# Patient Record
Sex: Female | Born: 1945
Health system: Southern US, Community
[De-identification: ages and names within clinical notes are randomized; demographics above are authoritative.]

## PROBLEM LIST (undated history)

## (undated) DIAGNOSIS — E785 Hyperlipidemia, unspecified: Secondary | ICD-10-CM

## (undated) DIAGNOSIS — M1990 Unspecified osteoarthritis, unspecified site: Secondary | ICD-10-CM

## (undated) DIAGNOSIS — I10 Essential (primary) hypertension: Secondary | ICD-10-CM

## (undated) HISTORY — DX: Essential (primary) hypertension: I10

## (undated) HISTORY — DX: Unspecified osteoarthritis, unspecified site: M19.90

## (undated) HISTORY — DX: Hyperlipidemia, unspecified: E78.5

---

## 2001-06-24 ENCOUNTER — Encounter: Payer: Self-pay | Admitting: Unknown Physician Specialty

## 2001-06-24 ENCOUNTER — Ambulatory Visit (HOSPITAL_COMMUNITY): Admission: RE | Admit: 2001-06-24 | Discharge: 2001-06-24 | Payer: Self-pay | Admitting: Unknown Physician Specialty

## 2001-12-27 ENCOUNTER — Ambulatory Visit (HOSPITAL_COMMUNITY): Admission: RE | Admit: 2001-12-27 | Discharge: 2001-12-27 | Payer: Self-pay | Admitting: Orthopaedic Surgery

## 2001-12-27 ENCOUNTER — Encounter: Payer: Self-pay | Admitting: Orthopaedic Surgery

## 2001-12-30 ENCOUNTER — Encounter: Payer: Self-pay | Admitting: Internal Medicine

## 2001-12-30 ENCOUNTER — Ambulatory Visit (HOSPITAL_COMMUNITY): Admission: RE | Admit: 2001-12-30 | Discharge: 2001-12-30 | Payer: Self-pay | Admitting: Internal Medicine

## 2002-11-02 ENCOUNTER — Ambulatory Visit (HOSPITAL_COMMUNITY): Admission: RE | Admit: 2002-11-02 | Discharge: 2002-11-02 | Payer: Self-pay | Admitting: Family Medicine

## 2002-11-02 ENCOUNTER — Encounter: Payer: Self-pay | Admitting: Family Medicine

## 2003-07-14 ENCOUNTER — Ambulatory Visit (HOSPITAL_COMMUNITY): Admission: RE | Admit: 2003-07-14 | Discharge: 2003-07-14 | Payer: Self-pay | Admitting: Unknown Physician Specialty

## 2003-07-14 ENCOUNTER — Encounter: Payer: Self-pay | Admitting: Emergency Medicine

## 2005-08-18 ENCOUNTER — Ambulatory Visit (HOSPITAL_COMMUNITY): Admission: RE | Admit: 2005-08-18 | Discharge: 2005-08-18 | Payer: Self-pay | Admitting: Ophthalmology

## 2005-09-30 ENCOUNTER — Ambulatory Visit (HOSPITAL_COMMUNITY): Admission: RE | Admit: 2005-09-30 | Discharge: 2005-09-30 | Payer: Self-pay | Admitting: Ophthalmology

## 2009-10-25 ENCOUNTER — Ambulatory Visit (HOSPITAL_COMMUNITY): Admission: RE | Admit: 2009-10-25 | Discharge: 2009-10-25 | Payer: Self-pay | Admitting: Family Medicine

## 2009-11-16 ENCOUNTER — Other Ambulatory Visit: Admission: RE | Admit: 2009-11-16 | Discharge: 2009-11-16 | Payer: Self-pay | Admitting: Family Medicine

## 2009-11-30 ENCOUNTER — Encounter: Payer: Self-pay | Admitting: Gastroenterology

## 2009-12-13 ENCOUNTER — Encounter: Payer: Self-pay | Admitting: Gastroenterology

## 2009-12-14 ENCOUNTER — Ambulatory Visit (HOSPITAL_COMMUNITY): Admission: RE | Admit: 2009-12-14 | Discharge: 2009-12-14 | Payer: Self-pay | Admitting: Gastroenterology

## 2009-12-14 ENCOUNTER — Ambulatory Visit: Payer: Self-pay | Admitting: Gastroenterology

## 2010-10-29 NOTE — Letter (Signed)
Summary: TRIAGE  TRIAGE   Imported By: Diana Eves 11/30/2009 12:06:04  _____________________________________________________________________  External Attachment:    Type:   Image     Comment:   External Document

## 2010-10-29 NOTE — Letter (Signed)
Summary: ADVANTRA AUTHORIZATION REQUEST   ADVANTRA AUTHORIZATION REQUEST   Imported By: Ave Filter 12/13/2009 15:15:55  _____________________________________________________________________  External Attachment:    Type:   Image     Comment:   External Document  Appended Document: ADVANTRA AUTHORIZATION REQUEST  Aut# [id number]

## 2011-03-24 ENCOUNTER — Other Ambulatory Visit (HOSPITAL_COMMUNITY): Payer: Self-pay | Admitting: Ophthalmology

## 2011-03-24 ENCOUNTER — Encounter (HOSPITAL_COMMUNITY)
Admission: RE | Admit: 2011-03-24 | Discharge: 2011-03-24 | Disposition: A | Discharge status: Home / Self Care | Payer: Medicare Other | Source: Ambulatory Visit | Attending: Ophthalmology | Admitting: Ophthalmology

## 2011-03-24 ENCOUNTER — Ambulatory Visit (HOSPITAL_COMMUNITY)
Admission: RE | Admit: 2011-03-24 | Discharge: 2011-03-24 | Disposition: A | Discharge status: Home / Self Care | Payer: Medicare Other | Source: Ambulatory Visit | Attending: Ophthalmology | Admitting: Ophthalmology

## 2011-03-24 DIAGNOSIS — H409 Unspecified glaucoma: Secondary | ICD-10-CM | POA: Insufficient documentation

## 2011-03-24 DIAGNOSIS — Z01818 Encounter for other preprocedural examination: Secondary | ICD-10-CM | POA: Insufficient documentation

## 2011-03-24 DIAGNOSIS — Z01810 Encounter for preprocedural cardiovascular examination: Secondary | ICD-10-CM | POA: Insufficient documentation

## 2011-03-24 DIAGNOSIS — Z01812 Encounter for preprocedural laboratory examination: Secondary | ICD-10-CM | POA: Insufficient documentation

## 2011-03-24 LAB — CBC
HCT: 38.5 % (ref 36.0–46.0)
MCV: 86.9 fL (ref 78.0–100.0)
RDW: 14 % (ref 11.5–15.5)
WBC: 9 10*3/uL (ref 4.0–10.5)

## 2011-03-24 LAB — BASIC METABOLIC PANEL
BUN: 17 mg/dL (ref 6–23)
Chloride: 100 mEq/L (ref 96–112)
Creatinine, Ser: 0.77 mg/dL (ref 0.50–1.10)
GFR calc Af Amer: >60 mL/min (ref >60)

## 2011-03-26 ENCOUNTER — Ambulatory Visit (HOSPITAL_COMMUNITY)
Admission: RE | Admit: 2011-03-26 | Discharge: 2011-03-26 | Disposition: A | Discharge status: Home / Self Care | Payer: Medicare Other | Source: Ambulatory Visit | Attending: Ophthalmology | Admitting: Ophthalmology

## 2011-03-26 DIAGNOSIS — I10 Essential (primary) hypertension: Secondary | ICD-10-CM | POA: Insufficient documentation

## 2011-03-26 DIAGNOSIS — Z01810 Encounter for preprocedural cardiovascular examination: Secondary | ICD-10-CM | POA: Insufficient documentation

## 2011-03-26 DIAGNOSIS — H4020X0 Unspecified primary angle-closure glaucoma, stage unspecified: Secondary | ICD-10-CM | POA: Insufficient documentation

## 2011-03-26 DIAGNOSIS — Z01812 Encounter for preprocedural laboratory examination: Secondary | ICD-10-CM | POA: Insufficient documentation

## 2011-03-26 DIAGNOSIS — Z01818 Encounter for other preprocedural examination: Secondary | ICD-10-CM | POA: Insufficient documentation

## 2011-03-26 DIAGNOSIS — E785 Hyperlipidemia, unspecified: Secondary | ICD-10-CM | POA: Insufficient documentation

## 2011-04-10 NOTE — Op Note (Signed)
Kim Reese, GABA NO.:  [id number]  MEDICAL RECORD NO.:  [id number]  LOCATION:  SDSC                         FACILITY:  [facility]  PHYSICIAN:  Chalmers Guest, M.D.     DATE OF BIRTH:  [birth year]  DATE OF PROCEDURE:  03/26/2011 DATE OF DISCHARGE:  03/26/2011                              OPERATIVE REPORT   PREOPERATIVE DIAGNOSIS:  Uncontrolled angle-closure glaucoma, left eye.  POSTOPERATIVE DIAGNOSIS:  Uncontrolled angle-closure glaucoma, left eye.  PROCEDURE:  Trabeculectomy with mitomycin C.  PREVIOUS HISTORY:  The patient has had maximum tolerated medical therapy as well as laser peripheral iridectomy, and has progressive visual field loss making the surgery medically necessary.  ANESTHESIA:  2% Xylocaine With Epinephrine, 50/50 mixture of 0.75% Marcaine with an ampule of Wydase.  COMPLICATIONS:  None.  DESCRIPTION OF PROCEDURE:  The patient was transported to the operating room where a peribulbar block was given with the aforementioned local anesthetic agent.  Following this, the patient's face was prepped and draped in usual sterile fashion after pressure had been applied to the eye.  With the surgeon sitting superiorly, a 6-0 nylon suture was passed through clear cornea to infraduct the eye with the eye in the intraductal position.  The Hoskin forceps was used to grasp the conjunctiva at the limbus and a sharp Westcott was used to make an incision at the limbus and then a blunt Westcott scissors were used to form a fornix-based conjunctival flap.  A tip blade was used to recess Tenon's fibers and bleeding was controlled with cautery.  A 45-degree blade was used to fashion a partial thickness scleral flap.  The Maumenee-Colibri forceps and the Grieshaber 5700 blade was used to dissect the scleral flap to the limbus.  Following this, mitomycin C 0.4 mg/mL was placed on a Gelfoam sponge placed under  the conjunctiva and allowed to stay on the eye for 2  minutes and then removed and irrigated with 50 mL of balanced salt solution.  At this point, a Wheeler blade 5100 was used through temporal clear cornea to enter the anterior chamber.  Following this with the scleral flap elevated, the 45-degree blade was used on the scleral flap to enter the anterior chamber and 1 x 3 block of tissue was removed.  The iris prolapsed.  Using a Chandler forceps and Vannas scissor, the peripheral iridectomy was formed as the sticky adherent floppy iris was still present.  Additional iris tissue was removed until the scleral opening was free.  Following this, scleral flap was sutured with five interrupted 10-0 nylon sutures.  The conjunctiva was then sutured using a 9-0 Vicryl on a BV 100 needle at each corner.  Following this, the anterior chamber was deepened with BSS.  The incision was stain and noted to be Seidel negative.  A subconjunctival injection of Kenalog 4 mg given in the inferior nasal quadrant.  Topical atropine and topical TobraDex ointment were applied to the eye.  Patch and Fox shield were placed and the patient returned to recovery area in stable condition.     Chalmers Guest, M.D.     RW/MEDQ  D:  03/26/2011  T:  03/27/2011  Job:  [id number]  Electronically Signed by Chalmers Guest M.D. on 04/10/2011 06:42:02 PM

## 2011-09-08 ENCOUNTER — Other Ambulatory Visit (HOSPITAL_COMMUNITY): Payer: Self-pay | Admitting: Family Medicine

## 2011-09-08 DIAGNOSIS — Z139 Encounter for screening, unspecified: Secondary | ICD-10-CM

## 2011-09-09 ENCOUNTER — Ambulatory Visit (HOSPITAL_COMMUNITY)
Admission: RE | Admit: 2011-09-09 | Discharge: 2011-09-09 | Disposition: A | Discharge status: Home / Self Care | Payer: Medicare Other | Source: Ambulatory Visit | Attending: Family Medicine | Admitting: Family Medicine

## 2011-09-09 DIAGNOSIS — Z1231 Encounter for screening mammogram for malignant neoplasm of breast: Secondary | ICD-10-CM | POA: Insufficient documentation

## 2011-09-09 DIAGNOSIS — Z139 Encounter for screening, unspecified: Secondary | ICD-10-CM

## 2011-09-10 ENCOUNTER — Other Ambulatory Visit (HOSPITAL_COMMUNITY)
Admission: RE | Admit: 2011-09-10 | Discharge: 2011-09-10 | Disposition: A | Discharge status: Home / Self Care | Payer: Medicare Other | Source: Ambulatory Visit | Attending: Family Medicine | Admitting: Family Medicine

## 2011-09-10 ENCOUNTER — Other Ambulatory Visit: Payer: Self-pay | Admitting: Family Medicine

## 2011-09-10 DIAGNOSIS — Z01419 Encounter for gynecological examination (general) (routine) without abnormal findings: Secondary | ICD-10-CM | POA: Insufficient documentation

## 2011-11-18 ENCOUNTER — Encounter (HOSPITAL_COMMUNITY): Payer: Self-pay | Admitting: Pharmacy Technician

## 2011-11-18 ENCOUNTER — Other Ambulatory Visit: Payer: Self-pay | Admitting: Ophthalmology

## 2011-11-20 NOTE — Pre-Procedure Instructions (Signed)
20 Kim Reese  11/20/2011   Your procedure is scheduled on:  FEB 27  Report to [facility] at 1030 AM.  Call this number if you have problems the morning of surgery: [phone]   Remember:   Do not eat food:After Midnight.  May have clear liquids: up to 4 Hours before arrival.  Clear liquids include soda, tea, black coffee, apple or grape juice, broth.  Take these medicines the morning of surgery with A SIP OF WATER: AMLODIPINE   Do not wear jewelry, make-up or nail polish.  Do not wear lotions, powders, or perfumes. You may wear deodorant.  Do not shave 48 hours prior to surgery.  Do not bring valuables to the hospital.  Contacts, dentures or bridgework may not be worn into surgery.  Leave suitcase in the car. After surgery it may be brought to your room.  For patients admitted to the hospital, checkout time is 11:00 AM the day of discharge.   Patients discharged the day of surgery will not be allowed to drive home.  Name and phone number of your driver: FAMILY  Special Instructions: CHG Shower Use Special Wash: 1/2 bottle night before surgery and 1/2 bottle morning of surgery.   Please read over the following fact sheets that you were given: Surgical Site Infection Prevention

## 2011-11-21 ENCOUNTER — Encounter (HOSPITAL_COMMUNITY)
Admission: RE | Admit: 2011-11-21 | Discharge: 2011-11-21 | Disposition: A | Discharge status: Home / Self Care | Payer: Medicare Other | Source: Ambulatory Visit | Attending: Ophthalmology | Admitting: Ophthalmology

## 2011-11-21 ENCOUNTER — Encounter (HOSPITAL_COMMUNITY)
Admission: RE | Admit: 2011-11-21 | Discharge: 2011-11-21 | Disposition: A | Discharge status: Home / Self Care | Payer: Medicare Other | Source: Ambulatory Visit | Attending: Anesthesiology | Admitting: Anesthesiology

## 2011-11-21 ENCOUNTER — Encounter (HOSPITAL_COMMUNITY): Payer: Self-pay

## 2011-11-21 LAB — CBC
HCT: 37.9 % (ref 36.0–46.0)
Hemoglobin: 13.1 g/dL (ref 12.0–15.0)
MCH: 29.8 pg (ref 26.0–34.0)
MCHC: 34.6 g/dL (ref 30.0–36.0)
MCV: 86.1 fL (ref 78.0–100.0)

## 2011-11-21 LAB — BASIC METABOLIC PANEL
BUN: 21 mg/dL (ref 6–23)
CO2: 27 mEq/L (ref 19–32)
Chloride: 103 mEq/L (ref 96–112)
GFR calc non Af Amer: 62 mL/min — ABNORMAL LOW (ref >90)
Glucose, Bld: 108 mg/dL — ABNORMAL HIGH (ref 70–99)
Potassium: 3.4 mEq/L — ABNORMAL LOW (ref 3.5–5.1)

## 2011-11-21 NOTE — Progress Notes (Signed)
Requested orders dr whitaker.requested ekg,dr [facility]  

## 2011-11-26 ENCOUNTER — Encounter (HOSPITAL_COMMUNITY)
Admission: RE | Disposition: A | Discharge status: Home / Self Care | Payer: Self-pay | Source: Ambulatory Visit | Attending: Ophthalmology

## 2011-11-26 ENCOUNTER — Encounter (HOSPITAL_COMMUNITY): Payer: Self-pay | Admitting: *Deleted

## 2011-11-26 ENCOUNTER — Ambulatory Visit (HOSPITAL_COMMUNITY)
Admission: RE | Admit: 2011-11-26 | Discharge: 2011-11-26 | Disposition: A | Discharge status: Home / Self Care | Payer: Medicare Other | Source: Ambulatory Visit | Attending: Ophthalmology | Admitting: Ophthalmology

## 2011-11-26 ENCOUNTER — Encounter (HOSPITAL_COMMUNITY): Payer: Self-pay | Admitting: Anesthesiology

## 2011-11-26 ENCOUNTER — Other Ambulatory Visit: Payer: Self-pay | Admitting: Ophthalmology

## 2011-11-26 ENCOUNTER — Ambulatory Visit (HOSPITAL_COMMUNITY): Payer: Medicare Other | Admitting: Anesthesiology

## 2011-11-26 DIAGNOSIS — H402290 Chronic angle-closure glaucoma, unspecified eye, stage unspecified: Secondary | ICD-10-CM | POA: Insufficient documentation

## 2011-11-26 DIAGNOSIS — H2510 Age-related nuclear cataract, unspecified eye: Secondary | ICD-10-CM | POA: Insufficient documentation

## 2011-11-26 DIAGNOSIS — H409 Unspecified glaucoma: Secondary | ICD-10-CM | POA: Insufficient documentation

## 2011-11-26 DIAGNOSIS — H25049 Posterior subcapsular polar age-related cataract, unspecified eye: Secondary | ICD-10-CM | POA: Insufficient documentation

## 2011-11-26 DIAGNOSIS — I10 Essential (primary) hypertension: Secondary | ICD-10-CM | POA: Insufficient documentation

## 2011-11-26 HISTORY — PX: CATARACT EXTRACTION W/PHACO: SHX586

## 2011-11-26 SURGERY — PHACOEMULSIFICATION, CATARACT, WITH IOL INSERTION
Anesthesia: Monitor Anesthesia Care | Site: Eye | Laterality: Left | Wound class: Clean

## 2011-11-26 MED ORDER — BSS IO SOLN
INTRAOCULAR | Status: DC | PRN
  Administered 2011-11-26: 500 mL via INTRAOCULAR

## 2011-11-26 MED ORDER — LIDOCAINE-EPINEPHRINE 2 %-1:100000 IJ SOLN
INTRAMUSCULAR | Status: DC | PRN
  Administered 2011-11-26: 10 mL

## 2011-11-26 MED ORDER — PHENYLEPHRINE HCL 2.5 % OP SOLN
1.0000 [drp] | OPHTHALMIC | Status: AC
  Administered 2011-11-26 (×3): 1 [drp] via OPHTHALMIC

## 2011-11-26 MED ORDER — TROPICAMIDE 1 % OP SOLN
1.0000 [drp] | OPHTHALMIC | Status: AC
  Administered 2011-11-26 (×3): 1 [drp] via OPHTHALMIC

## 2011-11-26 MED ORDER — PROVISC 10 MG/ML IO SOLN
INTRAOCULAR | Status: DC | PRN
  Administered 2011-11-26: .85 mL via INTRAOCULAR

## 2011-11-26 MED ORDER — TETRACAINE HCL 0.5 % OP SOLN
OPHTHALMIC | Status: DC | PRN
  Administered 2011-11-26: 1 [drp] via OPHTHALMIC

## 2011-11-26 MED ORDER — CYCLOPENTOLATE HCL 1 % OP SOLN
1.0000 [drp] | OPHTHALMIC | Status: AC
  Administered 2011-11-26 (×3): 1 [drp] via OPHTHALMIC

## 2011-11-26 MED ORDER — EPINEPHRINE HCL 1 MG/ML IJ SOLN
INTRAMUSCULAR | Status: DC | PRN
  Administered 2011-11-26: .3 mg

## 2011-11-26 MED ORDER — SODIUM CHLORIDE 0.9 % IV SOLN
INTRAVENOUS | Status: DC | PRN
  Administered 2011-11-26 (×2): via INTRAVENOUS

## 2011-11-26 MED ORDER — TROPICAMIDE 1 % OP SOLN
1.0000 [drp] | OPHTHALMIC | Status: AC
  Filled 2011-11-26: qty 3

## 2011-11-26 MED ORDER — MIDAZOLAM HCL 2 MG/2ML IJ SOLN: 1.0000 mg | INTRAMUSCULAR | Status: DC | PRN

## 2011-11-26 MED ORDER — FENTANYL CITRATE 0.05 MG/ML IJ SOLN
INTRAMUSCULAR | Status: DC | PRN
  Administered 2011-11-26: 50 ug via INTRAVENOUS

## 2011-11-26 MED ORDER — ONDANSETRON HCL 4 MG/2ML IJ SOLN
INTRAMUSCULAR | Status: DC | PRN
  Administered 2011-11-26: 4 mg via INTRAVENOUS

## 2011-11-26 MED ORDER — NA CHONDROIT SULF-NA HYALURON 40-30 MG/ML IO SOLN
INTRAOCULAR | Status: DC | PRN
  Administered 2011-11-26: 0.5 mL via INTRAOCULAR

## 2011-11-26 MED ORDER — FENTANYL CITRATE 0.05 MG/ML IJ SOLN: 50.0000 ug | INTRAMUSCULAR | Status: DC | PRN

## 2011-11-26 MED ORDER — PHENYLEPHRINE HCL 2.5 % OP SOLN
1.0000 [drp] | OPHTHALMIC | Status: AC
  Filled 2011-11-26: qty 3

## 2011-11-26 MED ORDER — KETOROLAC TROMETHAMINE 0.5 % OP SOLN
1.0000 [drp] | OPHTHALMIC | Status: AC
  Administered 2011-11-26 (×3): 1 [drp] via OPHTHALMIC

## 2011-11-26 MED ORDER — BUPIVACAINE HCL 0.75 % IJ SOLN
INTRAMUSCULAR | Status: DC | PRN
  Administered 2011-11-26: 10 mL

## 2011-11-26 MED ORDER — ACETYLCHOLINE CHLORIDE 1:100 IO SOLR
INTRAOCULAR | Status: DC | PRN
  Administered 2011-11-26: 10 mg via INTRAOCULAR

## 2011-11-26 MED ORDER — BSS IO SOLN
INTRAOCULAR | Status: DC | PRN
  Administered 2011-11-26: 15 mL via INTRAOCULAR

## 2011-11-26 MED ORDER — KETOROLAC TROMETHAMINE 0.5 % OP SOLN
1.0000 [drp] | OPHTHALMIC | Status: AC
  Filled 2011-11-26: qty 5

## 2011-11-26 MED ORDER — LIDOCAINE HCL (PF) 1 % IJ SOLN
INTRAMUSCULAR | Status: DC | PRN
  Administered 2011-11-26: 5 mg

## 2011-11-26 MED ORDER — PROPOFOL 10 MG/ML IV EMUL
INTRAVENOUS | Status: DC | PRN
  Administered 2011-11-26: 20 mg via INTRAVENOUS
  Administered 2011-11-26: 10 mg via INTRAVENOUS

## 2011-11-26 MED ORDER — CYCLOPENTOLATE HCL 1 % OP SOLN
1.0000 [drp] | OPHTHALMIC | Status: AC
  Filled 2011-11-26: qty 2

## 2011-11-26 MED ORDER — 0.9 % SODIUM CHLORIDE (POUR BTL) OPTIME
TOPICAL | Status: DC | PRN
  Administered 2011-11-26: 1000 mL

## 2011-11-26 MED ORDER — MIDAZOLAM HCL 5 MG/5ML IJ SOLN
INTRAMUSCULAR | Status: DC | PRN
  Administered 2011-11-26 (×2): 1 mg via INTRAVENOUS

## 2011-11-26 MED ORDER — HYALURONIDASE HUMAN 150 UNIT/ML IJ SOLN
INTRAMUSCULAR | Status: DC | PRN
  Administered 2011-11-26: 150 [IU]

## 2011-11-26 MED ORDER — GATIFLOXACIN 0.5 % OP SOLN
1.0000 [drp] | OPHTHALMIC | Status: AC
  Administered 2011-11-26 (×3): 1 [drp] via OPHTHALMIC

## 2011-11-26 MED ORDER — GATIFLOXACIN 0.5 % OP SOLN
1.0000 [drp] | OPHTHALMIC | Status: AC
  Filled 2011-11-26: qty 2.5

## 2011-11-26 MED ORDER — CYCLOPENTOLATE HCL 1 % OP SOLN: 1.0000 [drp] | OPHTHALMIC | Status: DC

## 2011-11-26 MED ORDER — PROPOFOL 10 MG/ML IV EMUL
INTRAVENOUS | Status: DC | PRN
  Administered 2011-11-26: 25 ug/kg/min via INTRAVENOUS
  Administered 2011-11-26: 20 ug/kg/min via INTRAVENOUS

## 2011-11-26 MED ORDER — TOBRAMYCIN 0.3 % OP OINT
TOPICAL_OINTMENT | OPHTHALMIC | Status: DC | PRN
  Administered 2011-11-26: 1 via OPHTHALMIC

## 2011-11-26 SURGICAL SUPPLY — 44 items
APL SRG 3 HI ABS STRL LF PLS (MISCELLANEOUS) ×1
APPLICATOR COTTON TIP 6IN STRL (MISCELLANEOUS) ×2 IMPLANT
APPLICATOR DR MATTHEWS STRL (MISCELLANEOUS) ×2 IMPLANT
BLADE KERATOME 2.75 (BLADE) ×2 IMPLANT
BLADE MINI RND TIP GREEN BEAV (BLADE) IMPLANT
BLADE STAB KNIFE 45DEG (BLADE) ×1 IMPLANT
CANNULA ANTERIOR CHAMBER 27GA (MISCELLANEOUS) ×2 IMPLANT
CLOTH BEACON ORANGE TIMEOUT ST (SAFETY) ×2 IMPLANT
CORDS BIPOLAR (ELECTRODE) IMPLANT
DRAPE OPHTHALMIC 40X48 W POUCH (DRAPES) ×2 IMPLANT
DRAPE RETRACTOR (MISCELLANEOUS) ×2 IMPLANT
FILTER BLUE MILLIPORE (MISCELLANEOUS) IMPLANT
GLOVE BIO SURGEON STRL SZ8 (GLOVE) ×4 IMPLANT
GLOVE BIOGEL PI IND STRL 6.5 (GLOVE) IMPLANT
GLOVE BIOGEL PI INDICATOR 6.5 (GLOVE) ×1
GLOVE SURG SS PI 6.5 STRL IVOR (GLOVE) ×2 IMPLANT
GOWN STRL NON-REIN LRG LVL3 (GOWN DISPOSABLE) ×5 IMPLANT
KIT ROOM TURNOVER OR (KITS) ×2 IMPLANT
KNIFE CRESCENT 2.5 55 ANG (BLADE) IMPLANT
LENS IOL ACRSF IQ PC 21.5 (Intraocular Lens) IMPLANT
LENS IOL ACRYSOF IQ POST 21.5 (Intraocular Lens) ×2 IMPLANT
MARKER SKIN DUAL TIP RULER LAB (MISCELLANEOUS) ×2 IMPLANT
MASK EYE SHIELD (GAUZE/BANDAGES/DRESSINGS) ×2 IMPLANT
NDL 25GX 5/8IN NON SAFETY (NEEDLE) ×1 IMPLANT
NEEDLE 25GX 5/8IN NON SAFETY (NEEDLE) ×2 IMPLANT
NEEDLE FILTER BLUNT 18X 1/2SAF (NEEDLE) ×2
NEEDLE FILTER BLUNT 18X1 1/2 (NEEDLE) ×2 IMPLANT
NS IRRIG 1000ML POUR BTL (IV SOLUTION) ×2 IMPLANT
PACK CATARACT CUSTOM (CUSTOM PROCEDURE TRAY) ×2 IMPLANT
PACK CATARACT MCHSCP (PACKS) ×2 IMPLANT
PAD ARMBOARD 7.5X6 YLW CONV (MISCELLANEOUS) ×3 IMPLANT
PAD EYE OVAL STERILE LF (GAUZE/BANDAGES/DRESSINGS) ×1 IMPLANT
PROBE ANTERIOR 20G W/INFUS NDL (MISCELLANEOUS) IMPLANT
SPEAR EYE SURG WECK-CEL (MISCELLANEOUS) IMPLANT
SUT ETHILON 10 0 CS140 6 (SUTURE) ×2 IMPLANT
SUT SILK 4 0 C 3 735G (SUTURE) IMPLANT
SUT SILK 6 0 G 6 (SUTURE) IMPLANT
SUT VICRYL 8 0 TG140 8 (SUTURE) IMPLANT
SYR 3ML LL SCALE MARK (SYRINGE) IMPLANT
SYR TB 1ML LUER SLIP (SYRINGE) ×2 IMPLANT
TAPE SURG TRANSPORE 1 IN (GAUZE/BANDAGES/DRESSINGS) ×1 IMPLANT
TAPE SURGICAL TRANSPORE 1 IN (GAUZE/BANDAGES/DRESSINGS) ×1
TOWEL OR 17X24 6PK STRL BLUE (TOWEL DISPOSABLE) ×4 IMPLANT
WATER STERILE IRR 1000ML POUR (IV SOLUTION) ×2 IMPLANT

## 2011-11-26 NOTE — Anesthesia Postprocedure Evaluation (Signed)
  Anesthesia Post-op Note  Patient: Kim Reese  Procedure(s) Performed: Procedure(s) (LRB): CATARACT EXTRACTION PHACO AND INTRAOCULAR LENS PLACEMENT (IOC) (Left)  Patient Location: PACU and Short Stay  Anesthesia Type: MAC  Level of Consciousness: oriented  Airway and Oxygen Therapy: Patient Spontanous Breathing  Post-op Pain: none  Post-op Assessment: Post-op Vital signs reviewed  Post-op Vital Signs: stable  Complications: No apparent anesthesia complications

## 2011-11-26 NOTE — Anesthesia Preprocedure Evaluation (Addendum)
Anesthesia Evaluation  Patient identified by MRN, date of birth, ID band Patient awake    Reviewed: Allergy & Precautions, H&P , NPO status , Patient's Chart, lab work & pertinent test results  Airway Mallampati: I TM Distance: >3 FB Neck ROM: Full    Dental   Pulmonary  clear to auscultation  Pulmonary exam normal       Cardiovascular hypertension, Pt. on medications Regular Normal    Neuro/Psych    GI/Hepatic   Endo/Other    Renal/GU      Musculoskeletal   Abdominal (+)  Abdomen: soft. Bowel sounds: normal.  Peds  Hematology   Anesthesia Other Findings   Reproductive/Obstetrics                        Anesthesia Physical Anesthesia Plan  ASA: II  Anesthesia Plan: MAC   Post-op Pain Management:    Induction: Intravenous  Airway Management Planned: Nasal Cannula  Additional Equipment:   Intra-op Plan:   Post-operative Plan:   Informed Consent: I have reviewed the patients History and Physical, chart, labs and discussed the procedure including the risks, benefits and alternatives for the proposed anesthesia with the patient or authorized representative who has indicated his/her understanding and acceptance.     Plan Discussed with: CRNA and Surgeon  Anesthesia Plan Comments:         Anesthesia Quick Evaluation

## 2011-11-26 NOTE — H&P (View-Only) (Signed)
Requested orders dr Harlon Flor.requested ekg,dr [facility]

## 2011-11-26 NOTE — Op Note (Signed)
Diagnosis: Visually significant cataract left eye following previous glaucoma filtration surgery left eye Postoperative diagnosis same Procedure: Phacoemulsification with intraocular lens implant left eye Complications: None Anesthesia: Topical Xylocaine with epinephrine with Marcaine and topical tetracaine Procedure: The patient was taken to the operating room with monitored anesthesia and the operating microscope in position the patient's face had been prepped and draped in the usual sterile fashion the surgeon was sitting temporally it is of note that the operating microscope for anterior segment surgery remains broken and it was necessary to use the retina scope which had an external ocular piece with a filter the lens on the scope was at 200 mm lens making the case much more difficult for visualization and focusing. A Weck-Cel was used to fixate the globe and a supersharp blade was used to enter through inferior clear cornea Viscoat was injected and the eye and preservative-free Xylocaine was also injected into the anterior chamber. Another Weck-Cel sponge was used to fixate the globe and a 2.75 mm keratome blade was used in a stepwise fashion the temporal clear cornea to into the eye. Following this a bent 25-gauge needle was used to incise the anterior capsule and a continuous tear curvilinear capsular rhexis was performed the capsule forceps was used to remove the anterior capsule. BSS was used to hydrodissect and hydrodelineate the nucleus it was noted that there was iris had to perform iridectomy and there was a superior filtration bleb which made the eye very soft and necessary to add additional viscoelastic during the case to keep the eye firm. Following this the phacoemulsification unit was then used to sculpt the nucleus creating for troughs the nuclear segments were separated with a Kuglen hook the viscoelastic was used to elevate the remaining 2 pieces of nucleus and it was aspirated with the  posterior capsule remaining intact. Following this Provisc was injected in the eye the irrigation aspiration device was used to remove cortical fibers and the epinucleus and the posterior capsule remained intact. At this point the intraocular lens implant was examined and it was an Alcon AcrySof S. and 60 WF 21.5 diopter lens SN #[id number] the lens was placed in the lens injector and injected into the eye and positioned with a Kuglen hook. I/A was then used to aspirate the viscoelastic from the eye Miochol was injected in the eye a single 10 on nylon suture was placed the eye was pressurized and was watertight with no leakage all instrumentation removed from the eye topical TobraDex ointment was applied as she'll was applied and the patient returned to recovery area in stable condition.

## 2011-11-26 NOTE — Transfer of Care (Signed)
Immediate Anesthesia Transfer of Care Note  Patient: Kim Reese  Procedure(s) Performed: Procedure(s) (LRB): CATARACT EXTRACTION PHACO AND INTRAOCULAR LENS PLACEMENT (IOC) (Left)  Patient Location: PACU and Short Stay  Anesthesia Type: MAC  Level of Consciousness: oriented  Airway & Oxygen Therapy: Patient Spontanous Breathing  Post-op Assessment: Post -op Vital signs reviewed and stable  Post vital signs: stable  Complications: No apparent anesthesia complications

## 2011-11-26 NOTE — Interval H&P Note (Signed)
History and Physical Interval Note:  11/26/2011 1:07 PM  Kim Reese  has presented today for surgery, with the diagnosis of CATARACT LEFT EYE  The various methods of treatment have been discussed with the patient and family. After consideration of risks, benefits and other options for treatment, the patient has consented to  Procedure(s) (LRB): CATARACT EXTRACTION PHACO AND INTRAOCULAR LENS PLACEMENT (IOC) (Left) as a surgical intervention .  The patients' history has been reviewed, patient examined, no change in status, stable for surgery.  I have reviewed the patients' chart and labs.  Questions were answered to the patient's satisfaction.     [name]

## 2011-11-26 NOTE — Anesthesia Procedure Notes (Signed)
Procedure Name: MAC Date/Time: 11/19/2011 1:26 PM Performed by: Ellin Goodie Pre-anesthesia Checklist: Patient identified, Emergency Drugs available, Suction available, Patient being monitored and Timeout performed Patient Re-evaluated:Patient Re-evaluated prior to inductionOxygen Delivery Method: Nasal cannula Preoxygenation: Pre-oxygenation with 100% oxygen Intubation Type: IV induction Placement Confirmation: positive ETCO2 Dental Injury: Teeth and Oropharynx as per pre-operative assessment

## 2011-11-27 ENCOUNTER — Encounter (HOSPITAL_COMMUNITY): Payer: Self-pay | Admitting: Ophthalmology

## 2011-11-27 ENCOUNTER — Other Ambulatory Visit: Payer: Self-pay | Admitting: Ophthalmology

## 2012-08-04 ENCOUNTER — Other Ambulatory Visit (HOSPITAL_COMMUNITY): Payer: Self-pay | Admitting: Family Medicine

## 2012-08-04 DIAGNOSIS — Z139 Encounter for screening, unspecified: Secondary | ICD-10-CM

## 2012-09-13 ENCOUNTER — Ambulatory Visit (HOSPITAL_COMMUNITY): Payer: Medicare Other

## 2012-09-20 ENCOUNTER — Ambulatory Visit (HOSPITAL_COMMUNITY): Payer: Medicare Other

## 2012-09-27 ENCOUNTER — Ambulatory Visit (HOSPITAL_COMMUNITY)
Admission: RE | Admit: 2012-09-27 | Discharge: 2012-09-27 | Disposition: A | Discharge status: Home / Self Care | Payer: Medicare Other | Source: Ambulatory Visit | Attending: Family Medicine | Admitting: Family Medicine

## 2012-09-27 DIAGNOSIS — Z139 Encounter for screening, unspecified: Secondary | ICD-10-CM

## 2012-09-27 DIAGNOSIS — Z1231 Encounter for screening mammogram for malignant neoplasm of breast: Secondary | ICD-10-CM | POA: Insufficient documentation

## 2012-10-22 DIAGNOSIS — R87810 Cervical high risk human papillomavirus (HPV) DNA test positive: Secondary | ICD-10-CM | POA: Insufficient documentation

## 2012-10-22 DIAGNOSIS — Z1151 Encounter for screening for human papillomavirus (HPV): Secondary | ICD-10-CM | POA: Insufficient documentation

## 2012-10-22 DIAGNOSIS — Z01419 Encounter for gynecological examination (general) (routine) without abnormal findings: Secondary | ICD-10-CM | POA: Insufficient documentation

## 2012-10-25 ENCOUNTER — Other Ambulatory Visit (HOSPITAL_COMMUNITY)
Admission: RE | Admit: 2012-10-25 | Discharge: 2012-10-25 | Disposition: A | Discharge status: Home / Self Care | Payer: Medicare Other | Source: Ambulatory Visit | Attending: Family Medicine | Admitting: Family Medicine

## 2012-10-25 ENCOUNTER — Other Ambulatory Visit: Payer: Self-pay | Admitting: Family Medicine

## 2012-10-25 ENCOUNTER — Inpatient Hospital Stay (HOSPITAL_COMMUNITY): Admit: 2012-10-25 | Payer: Self-pay | Source: Ambulatory Visit

## 2012-10-25 DIAGNOSIS — R87810 Cervical high risk human papillomavirus (HPV) DNA test positive: Secondary | ICD-10-CM | POA: Insufficient documentation

## 2013-03-03 ENCOUNTER — Encounter: Payer: Self-pay | Admitting: *Deleted

## 2013-03-14 ENCOUNTER — Ambulatory Visit (INDEPENDENT_AMBULATORY_CARE_PROVIDER_SITE_OTHER): Payer: Medicare Other | Admitting: Obstetrics & Gynecology

## 2013-03-14 ENCOUNTER — Encounter: Payer: Self-pay | Admitting: Obstetrics & Gynecology

## 2013-03-14 VITALS — BP 150/110 | Ht 63.0 in | Wt 154.0 lb

## 2013-03-14 DIAGNOSIS — R87810 Cervical high risk human papillomavirus (HPV) DNA test positive: Secondary | ICD-10-CM

## 2013-03-14 DIAGNOSIS — N870 Mild cervical dysplasia: Secondary | ICD-10-CM

## 2013-03-14 NOTE — Progress Notes (Signed)
Patient ID: Kim Reese, female   DOB: 1946-01-20, 67 y.o.   MRN: [id number] Referred for abnormal Pap(first one in patient's life) from Dr Loleta Chance at the [facility]  HPV ID done  Colposcopy  Normal  No AWE  No punctation  No mosaicism  Check HPV and repeat cytology next february

## 2013-09-02 ENCOUNTER — Other Ambulatory Visit (HOSPITAL_COMMUNITY): Payer: Self-pay | Admitting: Family Medicine

## 2013-09-02 DIAGNOSIS — Z139 Encounter for screening, unspecified: Secondary | ICD-10-CM

## 2013-10-03 ENCOUNTER — Ambulatory Visit (HOSPITAL_COMMUNITY)
Admission: RE | Admit: 2013-10-03 | Discharge: 2013-10-03 | Disposition: A | Discharge status: Home / Self Care | Payer: Medicare Other | Source: Ambulatory Visit | Attending: Family Medicine | Admitting: Family Medicine

## 2013-10-03 DIAGNOSIS — Z139 Encounter for screening, unspecified: Secondary | ICD-10-CM

## 2013-10-03 DIAGNOSIS — Z1231 Encounter for screening mammogram for malignant neoplasm of breast: Secondary | ICD-10-CM | POA: Insufficient documentation

## 2013-12-12 ENCOUNTER — Encounter: Payer: Self-pay | Admitting: Obstetrics & Gynecology

## 2013-12-12 ENCOUNTER — Other Ambulatory Visit (HOSPITAL_COMMUNITY)
Admission: RE | Admit: 2013-12-12 | Discharge: 2013-12-12 | Disposition: A | Discharge status: Home / Self Care | Payer: Medicare Other | Source: Ambulatory Visit | Attending: Obstetrics & Gynecology | Admitting: Obstetrics & Gynecology

## 2013-12-12 ENCOUNTER — Ambulatory Visit (INDEPENDENT_AMBULATORY_CARE_PROVIDER_SITE_OTHER): Payer: Medicare Other | Admitting: Obstetrics & Gynecology

## 2013-12-12 ENCOUNTER — Encounter (INDEPENDENT_AMBULATORY_CARE_PROVIDER_SITE_OTHER): Payer: Self-pay

## 2013-12-12 VITALS — Ht 61.0 in | Wt 148.0 lb

## 2013-12-12 DIAGNOSIS — Z124 Encounter for screening for malignant neoplasm of cervix: Secondary | ICD-10-CM | POA: Insufficient documentation

## 2013-12-12 DIAGNOSIS — Z1272 Encounter for screening for malignant neoplasm of vagina: Secondary | ICD-10-CM

## 2013-12-12 DIAGNOSIS — N870 Mild cervical dysplasia: Secondary | ICD-10-CM | POA: Insufficient documentation

## 2013-12-12 DIAGNOSIS — Z0142 Encounter for cervical smear to confirm findings of recent normal smear following initial abnormal smear: Secondary | ICD-10-CM

## 2013-12-12 NOTE — Progress Notes (Signed)
Patient ID: Kim Reese, female   DOB: 08-13-1946, 68 y.o.   MRN: [id number] Pt had abnormal pap and Positive HPV 09/2012.  I did colpo in 02/2013 with normal reults  Here today for follow up pap  Performed today  Past Medical History  Diagnosis Date  . Glaucoma   . Cataract   . Hypertension   . Hyperlipidemia   . Osteoarthritis     Past Surgical History  Procedure Laterality Date  . Cataract extraction w/phaco  11/26/2011    Procedure: CATARACT EXTRACTION PHACO AND INTRAOCULAR LENS PLACEMENT (IOC);  Surgeon: Marylynn Pearson, MD;  Location: [place];  Service: Ophthalmology;  Laterality: Left;    OB History   Grav Para Term Preterm Abortions TAB SAB Ect Mult Living   4 4              Allergies  Allergen Reactions  . Penicillins Rash    History   Social History  . Marital Status: Widowed    Spouse Name: N/A    Number of Children: N/A  . Years of Education: N/A   Social History Main Topics  . Smoking status: Former Research scientist (life sciences)  . Smokeless tobacco: None  . Alcohol Use: No  . Drug Use: No  . Sexual Activity: Yes   Other Topics Concern  . None   Social History Narrative  . None    Family History  Problem Relation Age of Onset  . Hypertension Mother   . Stroke Mother   . Cancer Father   . Other Sister     multiple myeloma  . Asthma Son    Follow up based on results

## 2013-12-12 NOTE — Addendum Note (Signed)
Addended by: Richardson ChiquitoRAVIS, ASHLEY M on: 12/12/2013 04:19 PM   Modules accepted: Orders

## 2014-01-17 ENCOUNTER — Telehealth: Payer: Self-pay | Admitting: *Deleted

## 2014-01-17 NOTE — Telephone Encounter (Signed)
Pt informed of abnormal pap from 12/12/2013, called transferred to front staff for colposcopy appointment to be made with Dr. Despina HiddenEure. Pt questions answered.

## 2014-01-17 NOTE — Telephone Encounter (Signed)
Message copied by Criss AlvinePULLIAM, Reggie Bise G on Tue Jan 17, 2014  9:11 AM ------      Message from: Lazaro ArmsEURE, LUTHER H      Created: Tue Jan 17, 2014  8:19 AM       Please call pt, inform her of abnormal Pap and schedule her colposcopy            Lazaro ArmsLuther H Eure            ----- Message -----         From: Lab in [facility] Interface         Sent: 12/15/2013   3:13 PM           To: Lazaro ArmsLuther H Eure, MD                   ------

## 2014-01-31 ENCOUNTER — Ambulatory Visit (INDEPENDENT_AMBULATORY_CARE_PROVIDER_SITE_OTHER): Payer: Medicare Other | Admitting: Obstetrics & Gynecology

## 2014-01-31 ENCOUNTER — Encounter: Payer: Self-pay | Admitting: Obstetrics & Gynecology

## 2014-01-31 VITALS — BP 140/80 | Wt 147.0 lb

## 2014-01-31 DIAGNOSIS — R87611 Atypical squamous cells cannot exclude high grade squamous intraepithelial lesion on cytologic smear of cervix (ASC-H): Secondary | ICD-10-CM | POA: Insufficient documentation

## 2014-01-31 DIAGNOSIS — R87810 Cervical high risk human papillomavirus (HPV) DNA test positive: Secondary | ICD-10-CM

## 2014-01-31 DIAGNOSIS — N870 Mild cervical dysplasia: Secondary | ICD-10-CM

## 2014-01-31 NOTE — Progress Notes (Signed)
Patient ID: Kim Reese, female   DOB: 09/15/1946, 68 y.o.   MRN: [id number] Pap 09/2012 ASCUS +HPV with normal colposcopy in 02/2013 Pap 11/2013 ASCUS cannot rule out High grade dysplasia  Colposcopy today: Adequate No acetowhite changes No punctation No mosaicism No abnormal vessels No abnormal vessels  No biopsy taken  Follow up Pap in 1 year  Past Medical History  Diagnosis Date  . Glaucoma   . Cataract   . Hypertension   . Hyperlipidemia   . Osteoarthritis     Past Surgical History  Procedure Laterality Date  . Cataract extraction w/phaco  11/26/2011    Procedure: CATARACT EXTRACTION PHACO AND INTRAOCULAR LENS PLACEMENT (IOC);  Surgeon: Marylynn Pearson, MD;  Location: [place];  Service: Ophthalmology;  Laterality: Left;    OB History   Grav Para Term Preterm Abortions TAB SAB Ect Mult Living   4 4              Allergies  Allergen Reactions  . Penicillins Rash    History   Social History  . Marital Status: Widowed    Spouse Name: N/A    Number of Children: N/A  . Years of Education: N/A   Social History Main Topics  . Smoking status: Former Research scientist (life sciences)  . Smokeless tobacco: None  . Alcohol Use: No  . Drug Use: No  . Sexual Activity: Yes   Other Topics Concern  . None   Social History Narrative  . None    Family History  Problem Relation Age of Onset  . Hypertension Mother   . Cancer Father   . Other Sister     multiple myeloma  . Asthma Son

## 2014-07-31 ENCOUNTER — Encounter: Payer: Self-pay | Admitting: Obstetrics & Gynecology

## 2014-09-14 ENCOUNTER — Other Ambulatory Visit (HOSPITAL_COMMUNITY): Payer: Self-pay | Admitting: Family Medicine

## 2014-09-14 DIAGNOSIS — Z1231 Encounter for screening mammogram for malignant neoplasm of breast: Secondary | ICD-10-CM

## 2014-10-09 ENCOUNTER — Ambulatory Visit (HOSPITAL_COMMUNITY)
Admission: RE | Admit: 2014-10-09 | Discharge: 2014-10-09 | Disposition: A | Discharge status: Home / Self Care | Payer: Medicare Other | Source: Ambulatory Visit | Attending: Family Medicine | Admitting: Family Medicine

## 2014-10-09 DIAGNOSIS — Z1231 Encounter for screening mammogram for malignant neoplasm of breast: Secondary | ICD-10-CM | POA: Insufficient documentation

## 2015-05-30 ENCOUNTER — Other Ambulatory Visit (HOSPITAL_COMMUNITY): Payer: Self-pay | Admitting: Family Medicine

## 2015-05-30 DIAGNOSIS — M1990 Unspecified osteoarthritis, unspecified site: Secondary | ICD-10-CM

## 2015-05-30 DIAGNOSIS — Z780 Asymptomatic menopausal state: Secondary | ICD-10-CM

## 2015-06-01 ENCOUNTER — Other Ambulatory Visit (HOSPITAL_COMMUNITY): Payer: Self-pay | Admitting: Family Medicine

## 2015-06-01 DIAGNOSIS — M1990 Unspecified osteoarthritis, unspecified site: Secondary | ICD-10-CM

## 2015-06-01 DIAGNOSIS — Z780 Asymptomatic menopausal state: Secondary | ICD-10-CM

## 2015-06-05 ENCOUNTER — Other Ambulatory Visit (HOSPITAL_COMMUNITY): Payer: Medicare Other

## 2015-10-15 ENCOUNTER — Other Ambulatory Visit (HOSPITAL_COMMUNITY): Payer: Self-pay | Admitting: Family Medicine

## 2015-10-15 DIAGNOSIS — Z1231 Encounter for screening mammogram for malignant neoplasm of breast: Secondary | ICD-10-CM

## 2015-10-18 ENCOUNTER — Ambulatory Visit (HOSPITAL_COMMUNITY)
Admission: RE | Admit: 2015-10-18 | Discharge: 2015-10-18 | Disposition: A | Discharge status: Home / Self Care | Payer: PPO | Source: Ambulatory Visit | Attending: Family Medicine | Admitting: Family Medicine

## 2015-10-18 DIAGNOSIS — Z1231 Encounter for screening mammogram for malignant neoplasm of breast: Secondary | ICD-10-CM | POA: Diagnosis not present

## 2015-11-28 DIAGNOSIS — E785 Hyperlipidemia, unspecified: Secondary | ICD-10-CM | POA: Diagnosis not present

## 2015-11-28 DIAGNOSIS — I10 Essential (primary) hypertension: Secondary | ICD-10-CM | POA: Diagnosis not present

## 2015-11-28 DIAGNOSIS — Z6828 Body mass index (BMI) 28.0-28.9, adult: Secondary | ICD-10-CM | POA: Diagnosis not present

## 2015-11-28 DIAGNOSIS — Z0000 Encounter for general adult medical examination without abnormal findings: Secondary | ICD-10-CM | POA: Diagnosis not present

## 2015-12-17 DIAGNOSIS — H2511 Age-related nuclear cataract, right eye: Secondary | ICD-10-CM | POA: Diagnosis not present

## 2015-12-17 DIAGNOSIS — H04123 Dry eye syndrome of bilateral lacrimal glands: Secondary | ICD-10-CM | POA: Diagnosis not present

## 2015-12-17 DIAGNOSIS — H401133 Primary open-angle glaucoma, bilateral, severe stage: Secondary | ICD-10-CM | POA: Diagnosis not present

## 2016-04-07 DIAGNOSIS — K219 Gastro-esophageal reflux disease without esophagitis: Secondary | ICD-10-CM | POA: Diagnosis not present

## 2016-04-07 DIAGNOSIS — I10 Essential (primary) hypertension: Secondary | ICD-10-CM | POA: Diagnosis not present

## 2016-04-07 DIAGNOSIS — E785 Hyperlipidemia, unspecified: Secondary | ICD-10-CM | POA: Diagnosis not present

## 2016-06-16 DIAGNOSIS — H04123 Dry eye syndrome of bilateral lacrimal glands: Secondary | ICD-10-CM | POA: Diagnosis not present

## 2016-06-16 DIAGNOSIS — H2511 Age-related nuclear cataract, right eye: Secondary | ICD-10-CM | POA: Diagnosis not present

## 2016-06-16 DIAGNOSIS — H401133 Primary open-angle glaucoma, bilateral, severe stage: Secondary | ICD-10-CM | POA: Diagnosis not present

## 2016-07-07 DIAGNOSIS — I10 Essential (primary) hypertension: Secondary | ICD-10-CM | POA: Diagnosis not present

## 2016-07-07 DIAGNOSIS — E782 Mixed hyperlipidemia: Secondary | ICD-10-CM | POA: Diagnosis not present

## 2016-07-07 DIAGNOSIS — E785 Hyperlipidemia, unspecified: Secondary | ICD-10-CM | POA: Diagnosis not present

## 2016-07-07 DIAGNOSIS — Z23 Encounter for immunization: Secondary | ICD-10-CM | POA: Diagnosis not present

## 2016-09-17 ENCOUNTER — Other Ambulatory Visit (HOSPITAL_COMMUNITY): Payer: Self-pay | Admitting: Family Medicine

## 2016-09-17 DIAGNOSIS — Z1231 Encounter for screening mammogram for malignant neoplasm of breast: Secondary | ICD-10-CM

## 2016-10-20 ENCOUNTER — Ambulatory Visit (HOSPITAL_COMMUNITY): Payer: PPO

## 2016-10-23 DIAGNOSIS — H04123 Dry eye syndrome of bilateral lacrimal glands: Secondary | ICD-10-CM | POA: Diagnosis not present

## 2016-10-23 DIAGNOSIS — H2511 Age-related nuclear cataract, right eye: Secondary | ICD-10-CM | POA: Diagnosis not present

## 2016-10-23 DIAGNOSIS — H401133 Primary open-angle glaucoma, bilateral, severe stage: Secondary | ICD-10-CM | POA: Diagnosis not present

## 2016-10-24 ENCOUNTER — Ambulatory Visit (HOSPITAL_COMMUNITY)
Admission: RE | Admit: 2016-10-24 | Discharge: 2016-10-24 | Disposition: A | Discharge status: Home / Self Care | Payer: PPO | Source: Ambulatory Visit | Attending: Family Medicine | Admitting: Family Medicine

## 2016-10-24 DIAGNOSIS — Z1231 Encounter for screening mammogram for malignant neoplasm of breast: Secondary | ICD-10-CM | POA: Insufficient documentation

## 2016-11-10 DIAGNOSIS — E785 Hyperlipidemia, unspecified: Secondary | ICD-10-CM | POA: Diagnosis not present

## 2016-11-10 DIAGNOSIS — Z0000 Encounter for general adult medical examination without abnormal findings: Secondary | ICD-10-CM | POA: Diagnosis not present

## 2016-11-10 DIAGNOSIS — N390 Urinary tract infection, site not specified: Secondary | ICD-10-CM | POA: Diagnosis not present

## 2016-11-10 DIAGNOSIS — I10 Essential (primary) hypertension: Secondary | ICD-10-CM | POA: Diagnosis not present

## 2017-02-16 DIAGNOSIS — I10 Essential (primary) hypertension: Secondary | ICD-10-CM | POA: Diagnosis not present

## 2017-02-24 DIAGNOSIS — H401133 Primary open-angle glaucoma, bilateral, severe stage: Secondary | ICD-10-CM | POA: Diagnosis not present

## 2017-02-24 DIAGNOSIS — H2511 Age-related nuclear cataract, right eye: Secondary | ICD-10-CM | POA: Diagnosis not present

## 2017-02-24 DIAGNOSIS — H04123 Dry eye syndrome of bilateral lacrimal glands: Secondary | ICD-10-CM | POA: Diagnosis not present

## 2017-03-24 DIAGNOSIS — H35373 Puckering of macula, bilateral: Secondary | ICD-10-CM | POA: Diagnosis not present

## 2017-03-24 DIAGNOSIS — H25813 Combined forms of age-related cataract, bilateral: Secondary | ICD-10-CM | POA: Diagnosis not present

## 2017-03-24 DIAGNOSIS — H04123 Dry eye syndrome of bilateral lacrimal glands: Secondary | ICD-10-CM | POA: Diagnosis not present

## 2017-03-24 DIAGNOSIS — H401133 Primary open-angle glaucoma, bilateral, severe stage: Secondary | ICD-10-CM | POA: Diagnosis not present

## 2017-05-18 DIAGNOSIS — R5383 Other fatigue: Secondary | ICD-10-CM | POA: Diagnosis not present

## 2017-05-18 DIAGNOSIS — I10 Essential (primary) hypertension: Secondary | ICD-10-CM | POA: Diagnosis not present

## 2017-07-20 DIAGNOSIS — R112 Nausea with vomiting, unspecified: Secondary | ICD-10-CM | POA: Diagnosis not present

## 2017-07-20 DIAGNOSIS — Z23 Encounter for immunization: Secondary | ICD-10-CM | POA: Diagnosis not present

## 2017-07-20 DIAGNOSIS — I10 Essential (primary) hypertension: Secondary | ICD-10-CM | POA: Diagnosis not present

## 2017-07-28 DIAGNOSIS — M5136 Other intervertebral disc degeneration, lumbar region: Secondary | ICD-10-CM | POA: Diagnosis not present

## 2017-09-01 DIAGNOSIS — I10 Essential (primary) hypertension: Secondary | ICD-10-CM | POA: Diagnosis not present

## 2017-09-01 DIAGNOSIS — M4186 Other forms of scoliosis, lumbar region: Secondary | ICD-10-CM | POA: Diagnosis not present

## 2017-09-01 DIAGNOSIS — E785 Hyperlipidemia, unspecified: Secondary | ICD-10-CM | POA: Diagnosis not present

## 2017-09-01 DIAGNOSIS — R55 Syncope and collapse: Secondary | ICD-10-CM | POA: Diagnosis not present

## 2017-09-15 DIAGNOSIS — I10 Essential (primary) hypertension: Secondary | ICD-10-CM | POA: Diagnosis not present

## 2017-09-15 DIAGNOSIS — R945 Abnormal results of liver function studies: Secondary | ICD-10-CM | POA: Diagnosis not present

## 2017-09-15 DIAGNOSIS — E785 Hyperlipidemia, unspecified: Secondary | ICD-10-CM | POA: Diagnosis not present

## 2017-09-15 DIAGNOSIS — E876 Hypokalemia: Secondary | ICD-10-CM | POA: Diagnosis not present

## 2017-09-24 DIAGNOSIS — R799 Abnormal finding of blood chemistry, unspecified: Secondary | ICD-10-CM | POA: Diagnosis not present

## 2017-09-30 ENCOUNTER — Ambulatory Visit (HOSPITAL_COMMUNITY): Admission: RE | Admit: 2017-09-30 | Payer: PPO | Source: Ambulatory Visit

## 2017-09-30 ENCOUNTER — Other Ambulatory Visit (HOSPITAL_COMMUNITY): Payer: Self-pay | Admitting: Family Medicine

## 2017-09-30 DIAGNOSIS — R55 Syncope and collapse: Secondary | ICD-10-CM

## 2017-10-01 ENCOUNTER — Ambulatory Visit (HOSPITAL_COMMUNITY)
Admission: RE | Admit: 2017-10-01 | Discharge: 2017-10-01 | Disposition: A | Discharge status: Home / Self Care | Payer: PPO | Source: Ambulatory Visit | Attending: Family Medicine | Admitting: Family Medicine

## 2017-10-01 DIAGNOSIS — I071 Rheumatic tricuspid insufficiency: Secondary | ICD-10-CM | POA: Insufficient documentation

## 2017-10-01 DIAGNOSIS — R55 Syncope and collapse: Secondary | ICD-10-CM

## 2017-10-01 LAB — ECHOCARDIOGRAM COMPLETE
CHL CUP MV DEC (S): 366
CHL CUP STROKE VOLUME: 17 mL
E decel time: 366 msec
EERAT: 3.46
FS: 28 % (ref 28–44)
IV/PV OW: 1.02
LA ID, A-P, ES: 23 mm
LA diam end sys: 23 mm
LA vol index: 13.5 mL/m2
LA vol: 23.2 mL
LADIAMINDEX: 1.34 cm/m2
LAVOLA4C: 17.9 mL
LV E/e' medial: 3.46
LV PW d: 8.87 mm — AB (ref 0.6–1.1)
LV dias vol index: 15 mL/m2
LV dias vol: 26 mL — AB (ref 46–106)
LV e' LATERAL: 13.3 cm/s
LV sys vol index: 5 mL/m2
LVEEAVG: 3.46
LVOT SV: 40 mL
LVOT VTI: 20.1 cm
LVOT area: 2.01 cm2
LVOT diameter: 16 mm
LVOT peak grad rest: 6 mmHg
LVOT peak vel: 123 cm/s
LVSYSVOL: 9 mL — AB
MV pk E vel: 46 m/s
MVPKAVEL: 82.5 m/s
RV sys press: 17 mmHg
Reg peak vel: 187 cm/s
Simpson's disk: 66
TDI e' lateral: 13.3
TDI e' medial: 7.62
TRMAXVEL: 187 cm/s

## 2017-10-01 NOTE — Progress Notes (Signed)
*  PRELIMINARY RESULTS* Echocardiogram 2D Echocardiogram has been performed.  Stacey DrainWhite, Dejion Grillo J 10/01/2017, 12:14 PM

## 2017-10-14 ENCOUNTER — Encounter: Payer: Self-pay | Admitting: *Deleted

## 2017-10-15 ENCOUNTER — Encounter: Payer: Self-pay | Admitting: Cardiovascular Disease

## 2017-10-15 ENCOUNTER — Ambulatory Visit (INDEPENDENT_AMBULATORY_CARE_PROVIDER_SITE_OTHER): Payer: PPO | Admitting: Cardiovascular Disease

## 2017-10-15 VITALS — BP 122/86 | HR 100 | Ht 62.0 in | Wt 125.0 lb

## 2017-10-15 DIAGNOSIS — R55 Syncope and collapse: Secondary | ICD-10-CM | POA: Diagnosis not present

## 2017-10-15 DIAGNOSIS — E876 Hypokalemia: Secondary | ICD-10-CM

## 2017-10-15 DIAGNOSIS — E785 Hyperlipidemia, unspecified: Secondary | ICD-10-CM | POA: Diagnosis not present

## 2017-10-15 DIAGNOSIS — I10 Essential (primary) hypertension: Secondary | ICD-10-CM

## 2017-10-15 NOTE — Patient Instructions (Signed)
Your physician recommends that you schedule a follow-up appointment in: 2 months with Dr.Koneswaran  Your physician has requested that you have a lexiscan myoview. For further information please visit [email or web address] Please follow instruction sheet, as given.    Your physician recommends that you continue on your current medications as directed. Please refer to the Current Medication list given to you today.    No lab work ordered today.      Thank you for choosing [facility] !

## 2017-10-15 NOTE — Progress Notes (Signed)
CARDIOLOGY CONSULT NOTE  Patient ID: Kim BarriosShirley Reese MRN: [id number] DOB/AGE: [birth year] 72 y.o.  Admit date: (Not on file) Primary Physician: Mirna MiresHill, Gerald, MD Referring Physician: Dr. Loleta ChanceHill  Reason for Consultation: Syncope  HPI: Kim BarriosShirley Reese is a 72 y.o. female who is being seen today for the evaluation of syncope at the request of Mirna MiresHill, Gerald, MD.   I reviewed notes from her PCP.  It appears she had an episode of syncope in late November 2018.  She had ultrasound of her liver due to mildly elevated liver transaminases as per records.  She had some hypokalemia.  Past medical history includes hypertension and hyperlipidemia.  I reviewed labs performed on 09/01/17: Total cholesterol 254, HDL 108, triglycerides 114, LDL 124, BUN 14, creatinine 0.84, potassium 3.4, sodium 139, AST 115, ALT 45, TSH 1.16, free T4 0.7, white blood cell 7.6, hemoglobin 13.4, platelets 183.  She reportedly had a previously abnormal ECG which I do not have a copy of.  Echocardiogram performed on 10/01/17 demonstrated normal left ventricular systolic function and regional wall motion, LVEF 60-65%, grade 1 diastolic dysfunction.  She wore a 48-hour Holter monitor which demonstrated sinus rhythm throughout.  There were rare PVCs and PACs and a brief 4 beat burst of paroxysmal atrial tachycardia.  Heart rate ranged from 68 bpm up to 139 bpm with an average heart rate of 85 bpm.  ECG performed today which I personally interpreted demonstrated sinus rhythm with late R wave transition.  She has no recollection of the event.  She is uncertain for how long she passed out.  She denies any antecedent chest pain, shortness of breath, or palpitations.  She has had no symptom recurrence.  She does say when she pushes a grocery cart she feels fatigued and has to sit down.  This is been going on for at least several months.  She has never passed out before.  She said she fell and lost her balance while at a game a few months  ago.  She denies loss of consciousness.  Family history: Her mother had to wear a heart monitor for some reason.   Allergies  Allergen Reactions  . Penicillins Rash    Current Outpatient Medications  Medication Sig Dispense Refill  . acetaminophen (TYLENOL) 650 MG CR tablet Take 1,300 mg by mouth every 8 (eight) hours as needed. For pain    . amLODipine (NORVASC) 10 MG tablet Take 10 mg by mouth daily.    Marland Kitchen. aspirin EC 81 MG tablet Take 81 mg by mouth daily.    . brimonidine-timolol (COMBIGAN) 0.2-0.5 % ophthalmic solution Place 1 drop into the right eye every 12 (twelve) hours.    . brinzolamide (AZOPT) 1 % ophthalmic suspension Place 1 drop into the right eye 3 (three) times daily.    . calcium carbonate (OS-CAL) 600 MG TABS Take 600 mg by mouth 2 (two) times daily with a meal.    . diphenhydrAMINE (BENADRYL) 25 MG tablet Take 50 mg by mouth every 6 (six) hours as needed. For allergies    . hydrochlorothiazide (HYDRODIURIL) 12.5 MG tablet Take 12.5 mg by mouth daily.    . Multiple Vitamin (MULITIVITAMIN WITH MINERALS) TABS Take 1 tablet by mouth daily.    Marland Kitchen. OVER THE COUNTER MEDICATION Place 2 drops into the left eye 3 (three) times daily as needed. For dry eyes --over the counter moisturizing eye drops    . POTASSIUM CHLORIDE PO Take by mouth.    .Marland Kitchen  pravastatin (PRAVACHOL) 20 MG tablet Take 20 mg by mouth daily.    . Travoprost, BAK Free, (TRAVATAN) 0.004 % SOLN ophthalmic solution Place 1 drop into the right eye at bedtime.     No current facility-administered medications for this visit.     Past Medical History:  Diagnosis Date  . Cataract   . Glaucoma   . Hyperlipidemia   . Hypertension   . Osteoarthritis     Past Surgical History:  Procedure Laterality Date  . CATARACT EXTRACTION W/PHACO  11/26/2011   Procedure: CATARACT EXTRACTION PHACO AND INTRAOCULAR LENS PLACEMENT (IOC);  Surgeon: Chalmers Guest, MD;  Location: [facility] OR;  Service: Ophthalmology;  Laterality: Left;     Social History   Socioeconomic History  . Marital status: Widowed    Spouse name: Not on file  . Number of children: Not on file  . Years of education: Not on file  . Highest education level: Not on file  Social Needs  . Financial resource strain: Not on file  . Food insecurity - worry: Not on file  . Food insecurity - inability: Not on file  . Transportation needs - medical: Not on file  . Transportation needs - non-medical: Not on file  Occupational History  . Not on file  Tobacco Use  . Smoking status: Former Smoker    Packs/day: 1.50    Types: Cigarettes    Start date: 10/15/1964    Last attempt to quit: 10/15/1997    Years since quitting: 20.0  . Smokeless tobacco: Never Used  Substance and Sexual Activity  . Alcohol use: No  . Drug use: No  . Sexual activity: Yes  Other Topics Concern  . Not on file  Social History Narrative  . Not on file     Current Meds  Medication Sig  . acetaminophen (TYLENOL) 650 MG CR tablet Take 1,300 mg by mouth every 8 (eight) hours as needed. For pain  . amLODipine (NORVASC) 10 MG tablet Take 10 mg by mouth daily.  Marland Kitchen aspirin EC 81 MG tablet Take 81 mg by mouth daily.  . brimonidine-timolol (COMBIGAN) 0.2-0.5 % ophthalmic solution Place 1 drop into the right eye every 12 (twelve) hours.  . brinzolamide (AZOPT) 1 % ophthalmic suspension Place 1 drop into the right eye 3 (three) times daily.  . calcium carbonate (OS-CAL) 600 MG TABS Take 600 mg by mouth 2 (two) times daily with a meal.  . diphenhydrAMINE (BENADRYL) 25 MG tablet Take 50 mg by mouth every 6 (six) hours as needed. For allergies  . hydrochlorothiazide (HYDRODIURIL) 12.5 MG tablet Take 12.5 mg by mouth daily.  . Multiple Vitamin (MULITIVITAMIN WITH MINERALS) TABS Take 1 tablet by mouth daily.  Marland Kitchen OVER THE COUNTER MEDICATION Place 2 drops into the left eye 3 (three) times daily as needed. For dry eyes --over the counter moisturizing eye drops  . POTASSIUM CHLORIDE PO Take by  mouth.  . pravastatin (PRAVACHOL) 20 MG tablet Take 20 mg by mouth daily.  . Travoprost, BAK Free, (TRAVATAN) 0.004 % SOLN ophthalmic solution Place 1 drop into the right eye at bedtime.      Review of systems complete and found to be negative unless listed above in HPI    Physical exam Blood pressure 122/86, pulse 100, height 5\' 2"  (1.575 m), weight 125 lb (56.7 kg), SpO2 98 %. General: NAD Neck: No JVD, no thyromegaly or thyroid nodule.  Lungs: Clear to auscultation bilaterally with normal respiratory effort. CV: Nondisplaced PMI. Regular  rate and rhythm, normal S1/S2, no S3/S4, no murmur.  No peripheral edema.  No carotid bruit.    Abdomen: Soft, nontender, no distention.  Skin: Intact without lesions or rashes.  Neurologic: Alert and oriented x 3.  Psych: Somewhat flat affect. Extremities: No clubbing or cyanosis.  HEENT: Normal.   ECG: Most recent ECG reviewed.   Labs: Lab Results  Component Value Date/Time   K 3.4 (L) 11/21/2011 02:06 PM   BUN 21 11/21/2011 02:06 PM   CREATININE 0.93 11/21/2011 02:06 PM   HGB 13.1 11/21/2011 02:06 PM     Lipids: No results found for: LDLCALC, LDLDIRECT, CHOL, TRIG, HDL      ASSESSMENT AND PLAN:  1.  Syncope: No symptom recurrence.  No significant arrhythmias seen with Holter monitoring. Left ventricular systolic function is normal.  I will obtain a Lexiscan Myoview stress test to evaluate for an ischemic etiology.  2.  Hypertension: Controlled.  No changes.  3.  Hyperlipidemia: Lipids reviewed above.  Continue pravastatin.  4.  Hypokalemia: Currently takes potassium chloride every other day.     Disposition: Follow up in 2 months  Signed: Prentice Docker, M.D., F.A.C.C.  10/15/2017, 9:29 AM

## 2017-10-16 DIAGNOSIS — H25811 Combined forms of age-related cataract, right eye: Secondary | ICD-10-CM | POA: Diagnosis not present

## 2017-10-16 DIAGNOSIS — Z961 Presence of intraocular lens: Secondary | ICD-10-CM | POA: Diagnosis not present

## 2017-10-16 DIAGNOSIS — H401133 Primary open-angle glaucoma, bilateral, severe stage: Secondary | ICD-10-CM | POA: Diagnosis not present

## 2017-10-23 ENCOUNTER — Encounter (HOSPITAL_COMMUNITY): Payer: Self-pay

## 2017-10-23 ENCOUNTER — Encounter (HOSPITAL_BASED_OUTPATIENT_CLINIC_OR_DEPARTMENT_OTHER)
Admission: RE | Admit: 2017-10-23 | Discharge: 2017-10-23 | Disposition: A | Discharge status: Home / Self Care | Payer: PPO | Source: Ambulatory Visit | Attending: Cardiovascular Disease | Admitting: Cardiovascular Disease

## 2017-10-23 ENCOUNTER — Encounter (HOSPITAL_COMMUNITY)
Admission: RE | Admit: 2017-10-23 | Discharge: 2017-10-23 | Disposition: A | Discharge status: Home / Self Care | Payer: PPO | Source: Ambulatory Visit | Attending: Cardiovascular Disease | Admitting: Cardiovascular Disease

## 2017-10-23 DIAGNOSIS — R55 Syncope and collapse: Secondary | ICD-10-CM | POA: Diagnosis not present

## 2017-10-23 LAB — NM MYOCAR MULTI W/SPECT W/WALL MOTION / EF
CSEPPHR: 150 {beats}/min
LVDIAVOL: 35 mL (ref 46–106)
LVSYSVOL: 8 mL
RATE: 0.33
Rest HR: 77 {beats}/min
SDS: 1
SRS: 9
SSS: 10
TID: 0.95

## 2017-10-23 MED ORDER — SODIUM CHLORIDE 0.9% FLUSH
INTRAVENOUS | Status: AC
  Administered 2017-10-23: 10 mL via INTRAVENOUS
  Filled 2017-10-23: qty 10

## 2017-10-23 MED ORDER — TECHNETIUM TC 99M TETROFOSMIN IV KIT
10.0000 | PACK | Freq: Once | INTRAVENOUS | Status: AC | PRN
  Administered 2017-10-23: 10 via INTRAVENOUS

## 2017-10-23 MED ORDER — TECHNETIUM TC 99M TETROFOSMIN IV KIT
30.0000 | PACK | Freq: Once | INTRAVENOUS | Status: AC | PRN
  Administered 2017-10-23: 30 via INTRAVENOUS

## 2017-10-23 MED ORDER — REGADENOSON 0.4 MG/5ML IV SOLN
INTRAVENOUS | Status: AC
  Administered 2017-10-23: 0.4 mg via INTRAVENOUS
  Filled 2017-10-23: qty 5

## 2017-10-27 DIAGNOSIS — R55 Syncope and collapse: Secondary | ICD-10-CM | POA: Diagnosis not present

## 2017-10-27 DIAGNOSIS — R945 Abnormal results of liver function studies: Secondary | ICD-10-CM | POA: Diagnosis not present

## 2017-10-27 DIAGNOSIS — I10 Essential (primary) hypertension: Secondary | ICD-10-CM | POA: Diagnosis not present

## 2017-10-27 DIAGNOSIS — R634 Abnormal weight loss: Secondary | ICD-10-CM | POA: Diagnosis not present

## 2017-10-28 ENCOUNTER — Other Ambulatory Visit: Payer: Self-pay | Admitting: *Deleted

## 2017-10-28 DIAGNOSIS — R55 Syncope and collapse: Secondary | ICD-10-CM

## 2017-11-02 ENCOUNTER — Other Ambulatory Visit: Payer: Self-pay | Admitting: Family Medicine

## 2017-11-02 DIAGNOSIS — R55 Syncope and collapse: Secondary | ICD-10-CM

## 2017-11-02 DIAGNOSIS — R1012 Left upper quadrant pain: Secondary | ICD-10-CM

## 2017-11-02 DIAGNOSIS — R634 Abnormal weight loss: Secondary | ICD-10-CM

## 2017-11-11 ENCOUNTER — Encounter (HOSPITAL_COMMUNITY): Payer: Self-pay

## 2017-11-11 ENCOUNTER — Ambulatory Visit (INDEPENDENT_AMBULATORY_CARE_PROVIDER_SITE_OTHER): Payer: PPO

## 2017-11-11 ENCOUNTER — Ambulatory Visit (HOSPITAL_COMMUNITY)
Admission: RE | Admit: 2017-11-11 | Discharge: 2017-11-11 | Disposition: A | Discharge status: Home / Self Care | Payer: PPO | Source: Ambulatory Visit | Attending: Family Medicine | Admitting: Family Medicine

## 2017-11-11 DIAGNOSIS — R634 Abnormal weight loss: Secondary | ICD-10-CM | POA: Diagnosis present

## 2017-11-11 DIAGNOSIS — G319 Degenerative disease of nervous system, unspecified: Secondary | ICD-10-CM | POA: Insufficient documentation

## 2017-11-11 DIAGNOSIS — N281 Cyst of kidney, acquired: Secondary | ICD-10-CM | POA: Diagnosis not present

## 2017-11-11 DIAGNOSIS — K760 Fatty (change of) liver, not elsewhere classified: Secondary | ICD-10-CM | POA: Diagnosis not present

## 2017-11-11 DIAGNOSIS — R55 Syncope and collapse: Secondary | ICD-10-CM | POA: Diagnosis not present

## 2017-11-11 DIAGNOSIS — R1012 Left upper quadrant pain: Secondary | ICD-10-CM

## 2017-11-11 DIAGNOSIS — R9082 White matter disease, unspecified: Secondary | ICD-10-CM | POA: Diagnosis not present

## 2017-11-11 DIAGNOSIS — S0990XA Unspecified injury of head, initial encounter: Secondary | ICD-10-CM | POA: Diagnosis not present

## 2017-11-11 MED ORDER — IOPAMIDOL (ISOVUE-300) INJECTION 61%
100.0000 mL | Freq: Once | INTRAVENOUS | Status: AC | PRN
  Administered 2017-11-11: 100 mL via INTRAVENOUS

## 2017-11-30 DIAGNOSIS — R55 Syncope and collapse: Secondary | ICD-10-CM | POA: Diagnosis not present

## 2017-11-30 DIAGNOSIS — R634 Abnormal weight loss: Secondary | ICD-10-CM | POA: Diagnosis not present

## 2017-11-30 DIAGNOSIS — I10 Essential (primary) hypertension: Secondary | ICD-10-CM | POA: Diagnosis not present

## 2017-12-15 DIAGNOSIS — R739 Hyperglycemia, unspecified: Secondary | ICD-10-CM | POA: Diagnosis not present

## 2017-12-15 DIAGNOSIS — I10 Essential (primary) hypertension: Secondary | ICD-10-CM | POA: Diagnosis not present

## 2017-12-29 ENCOUNTER — Ambulatory Visit: Payer: PPO | Admitting: Cardiovascular Disease

## 2018-01-12 DIAGNOSIS — R739 Hyperglycemia, unspecified: Secondary | ICD-10-CM | POA: Diagnosis not present

## 2018-01-12 DIAGNOSIS — R5382 Chronic fatigue, unspecified: Secondary | ICD-10-CM | POA: Diagnosis not present

## 2018-01-12 DIAGNOSIS — R634 Abnormal weight loss: Secondary | ICD-10-CM | POA: Diagnosis not present

## 2018-01-19 ENCOUNTER — Other Ambulatory Visit (HOSPITAL_COMMUNITY): Payer: Self-pay | Admitting: Family Medicine

## 2018-01-19 ENCOUNTER — Other Ambulatory Visit: Payer: Self-pay | Admitting: Family Medicine

## 2018-01-19 DIAGNOSIS — M79604 Pain in right leg: Secondary | ICD-10-CM

## 2018-01-19 DIAGNOSIS — R2241 Localized swelling, mass and lump, right lower limb: Secondary | ICD-10-CM

## 2018-01-21 ENCOUNTER — Ambulatory Visit (HOSPITAL_COMMUNITY)
Admission: RE | Admit: 2018-01-21 | Discharge: 2018-01-21 | Disposition: A | Discharge status: Home / Self Care | Payer: PPO | Source: Ambulatory Visit | Attending: Family Medicine | Admitting: Family Medicine

## 2018-01-21 ENCOUNTER — Other Ambulatory Visit (HOSPITAL_COMMUNITY): Payer: Self-pay | Admitting: Family Medicine

## 2018-01-21 DIAGNOSIS — R2241 Localized swelling, mass and lump, right lower limb: Secondary | ICD-10-CM

## 2018-01-21 DIAGNOSIS — R9389 Abnormal findings on diagnostic imaging of other specified body structures: Secondary | ICD-10-CM | POA: Diagnosis not present

## 2018-01-26 ENCOUNTER — Other Ambulatory Visit (HOSPITAL_COMMUNITY): Payer: Self-pay | Admitting: Family Medicine

## 2018-01-26 DIAGNOSIS — R2241 Localized swelling, mass and lump, right lower limb: Secondary | ICD-10-CM

## 2018-02-02 ENCOUNTER — Ambulatory Visit (HOSPITAL_COMMUNITY)
Admission: RE | Admit: 2018-02-02 | Discharge: 2018-02-02 | Disposition: A | Discharge status: Home / Self Care | Payer: PPO | Source: Ambulatory Visit | Attending: Family Medicine | Admitting: Family Medicine

## 2018-02-02 DIAGNOSIS — R2241 Localized swelling, mass and lump, right lower limb: Secondary | ICD-10-CM | POA: Insufficient documentation

## 2018-02-02 DIAGNOSIS — S8991XA Unspecified injury of right lower leg, initial encounter: Secondary | ICD-10-CM | POA: Diagnosis not present

## 2018-02-02 DIAGNOSIS — M7989 Other specified soft tissue disorders: Secondary | ICD-10-CM | POA: Diagnosis not present

## 2018-02-02 LAB — POCT I-STAT CREATININE: CREATININE: 0.9 mg/dL (ref 0.44–1.00)

## 2018-02-02 MED ORDER — GADOBENATE DIMEGLUMINE 529 MG/ML IV SOLN
10.0000 mL | Freq: Once | INTRAVENOUS | Status: AC | PRN
  Administered 2018-02-02: 10 mL via INTRAVENOUS

## 2018-02-26 DIAGNOSIS — H401133 Primary open-angle glaucoma, bilateral, severe stage: Secondary | ICD-10-CM | POA: Diagnosis not present

## 2018-02-26 DIAGNOSIS — Z961 Presence of intraocular lens: Secondary | ICD-10-CM | POA: Diagnosis not present

## 2018-02-26 DIAGNOSIS — H25811 Combined forms of age-related cataract, right eye: Secondary | ICD-10-CM | POA: Diagnosis not present

## 2018-03-08 DIAGNOSIS — S8011XA Contusion of right lower leg, initial encounter: Secondary | ICD-10-CM | POA: Diagnosis not present

## 2018-03-08 DIAGNOSIS — R739 Hyperglycemia, unspecified: Secondary | ICD-10-CM | POA: Diagnosis not present

## 2018-03-08 DIAGNOSIS — I10 Essential (primary) hypertension: Secondary | ICD-10-CM | POA: Diagnosis not present

## 2018-03-08 DIAGNOSIS — I951 Orthostatic hypotension: Secondary | ICD-10-CM | POA: Diagnosis not present

## 2018-04-12 DIAGNOSIS — R296 Repeated falls: Secondary | ICD-10-CM | POA: Diagnosis not present

## 2018-04-12 DIAGNOSIS — Z6820 Body mass index (BMI) 20.0-20.9, adult: Secondary | ICD-10-CM | POA: Diagnosis not present

## 2018-04-12 DIAGNOSIS — R634 Abnormal weight loss: Secondary | ICD-10-CM | POA: Diagnosis not present

## 2018-04-12 DIAGNOSIS — I10 Essential (primary) hypertension: Secondary | ICD-10-CM | POA: Diagnosis not present

## 2018-05-08 ENCOUNTER — Observation Stay (HOSPITAL_COMMUNITY)
Admission: EM | Admit: 2018-05-08 | Discharge: 2018-05-10 | Disposition: A | Discharge status: Home / Self Care | Payer: PPO | Attending: Internal Medicine | Admitting: Internal Medicine

## 2018-05-08 ENCOUNTER — Encounter (HOSPITAL_COMMUNITY): Payer: Self-pay | Admitting: Emergency Medicine

## 2018-05-08 ENCOUNTER — Emergency Department (HOSPITAL_COMMUNITY): Payer: PPO

## 2018-05-08 ENCOUNTER — Other Ambulatory Visit: Payer: Self-pay

## 2018-05-08 DIAGNOSIS — R1030 Lower abdominal pain, unspecified: Secondary | ICD-10-CM | POA: Diagnosis not present

## 2018-05-08 DIAGNOSIS — I10 Essential (primary) hypertension: Secondary | ICD-10-CM | POA: Diagnosis not present

## 2018-05-08 DIAGNOSIS — E785 Hyperlipidemia, unspecified: Secondary | ICD-10-CM | POA: Diagnosis present

## 2018-05-08 DIAGNOSIS — R1013 Epigastric pain: Secondary | ICD-10-CM | POA: Diagnosis not present

## 2018-05-08 DIAGNOSIS — R109 Unspecified abdominal pain: Secondary | ICD-10-CM | POA: Diagnosis present

## 2018-05-08 DIAGNOSIS — R945 Abnormal results of liver function studies: Secondary | ICD-10-CM | POA: Diagnosis not present

## 2018-05-08 DIAGNOSIS — F1010 Alcohol abuse, uncomplicated: Secondary | ICD-10-CM | POA: Diagnosis not present

## 2018-05-08 DIAGNOSIS — J984 Other disorders of lung: Secondary | ICD-10-CM | POA: Diagnosis not present

## 2018-05-08 DIAGNOSIS — I701 Atherosclerosis of renal artery: Secondary | ICD-10-CM | POA: Diagnosis not present

## 2018-05-08 DIAGNOSIS — E876 Hypokalemia: Secondary | ICD-10-CM | POA: Diagnosis not present

## 2018-05-08 DIAGNOSIS — Z87891 Personal history of nicotine dependence: Secondary | ICD-10-CM | POA: Diagnosis not present

## 2018-05-08 DIAGNOSIS — K760 Fatty (change of) liver, not elsewhere classified: Secondary | ICD-10-CM | POA: Diagnosis not present

## 2018-05-08 DIAGNOSIS — R7989 Other specified abnormal findings of blood chemistry: Secondary | ICD-10-CM | POA: Diagnosis present

## 2018-05-08 LAB — PROTIME-INR
INR: 1.5
PROTHROMBIN TIME: 18 s — AB (ref 11.4–15.2)

## 2018-05-08 LAB — URINALYSIS, ROUTINE W REFLEX MICROSCOPIC
BILIRUBIN URINE: NEGATIVE
GLUCOSE, UA: NEGATIVE mg/dL
Ketones, ur: 5 mg/dL — AB
NITRITE: NEGATIVE
Protein, ur: NEGATIVE mg/dL
SPECIFIC GRAVITY, URINE: 1.028 (ref 1.005–1.030)
pH: 7 (ref 5.0–8.0)

## 2018-05-08 LAB — CBC
HCT: 34.6 % — ABNORMAL LOW (ref 36.0–46.0)
Hemoglobin: 11.8 g/dL — ABNORMAL LOW (ref 12.0–15.0)
MCH: 32.8 pg (ref 26.0–34.0)
MCHC: 34.1 g/dL (ref 30.0–36.0)
MCV: 96.1 fL (ref 78.0–100.0)
PLATELETS: 143 10*3/uL — AB (ref 150–400)
RBC: 3.6 MIL/uL — AB (ref 3.87–5.11)
RDW: 14.6 % (ref 11.5–15.5)
WBC: 5.2 10*3/uL (ref 4.0–10.5)

## 2018-05-08 LAB — COMPREHENSIVE METABOLIC PANEL
ALT: 613 U/L — AB (ref 0–44)
AST: 2836 U/L — ABNORMAL HIGH (ref 15–41)
Albumin: 2.8 g/dL — ABNORMAL LOW (ref 3.5–5.0)
Alkaline Phosphatase: 168 U/L — ABNORMAL HIGH (ref 38–126)
Anion gap: 12 (ref 5–15)
BUN: 10 mg/dL (ref 8–23)
CHLORIDE: 96 mmol/L — AB (ref 98–111)
CO2: 26 mmol/L (ref 22–32)
CREATININE: 0.76 mg/dL (ref 0.44–1.00)
Calcium: 8.2 mg/dL — ABNORMAL LOW (ref 8.9–10.3)
Glucose, Bld: 103 mg/dL — ABNORMAL HIGH (ref 70–99)
Potassium: 2.8 mmol/L — ABNORMAL LOW (ref 3.5–5.1)
Sodium: 134 mmol/L — ABNORMAL LOW (ref 135–145)
TOTAL PROTEIN: 6.8 g/dL (ref 6.5–8.1)
Total Bilirubin: 3.3 mg/dL — ABNORMAL HIGH (ref 0.3–1.2)

## 2018-05-08 LAB — AMMONIA: AMMONIA: 17 umol/L (ref 9–35)

## 2018-05-08 LAB — I-STAT TROPONIN, ED: Troponin i, poc: 0 ng/mL (ref 0.00–0.08)

## 2018-05-08 LAB — LIPASE, BLOOD: LIPASE: 56 U/L — AB (ref 11–51)

## 2018-05-08 LAB — ACETAMINOPHEN LEVEL: Acetaminophen (Tylenol), Serum: <10 ug/mL — ABNORMAL LOW (ref 10–30)

## 2018-05-08 LAB — ETHANOL

## 2018-05-08 MED ORDER — SODIUM CHLORIDE 0.9 % IV BOLUS
500.0000 mL | Freq: Once | INTRAVENOUS | Status: AC
  Administered 2018-05-08: 500 mL via INTRAVENOUS

## 2018-05-08 MED ORDER — ONDANSETRON HCL 4 MG PO TABS: 4.0000 mg | ORAL_TABLET | Freq: Four times a day (QID) | ORAL | Status: DC | PRN

## 2018-05-08 MED ORDER — POTASSIUM CHLORIDE CRYS ER 20 MEQ PO TBCR
40.0000 meq | EXTENDED_RELEASE_TABLET | Freq: Once | ORAL | Status: AC
  Administered 2018-05-08: 40 meq via ORAL
  Filled 2018-05-08: qty 2

## 2018-05-08 MED ORDER — SODIUM CHLORIDE 0.9 % IV SOLN
INTRAVENOUS | Status: DC
  Administered 2018-05-08 – 2018-05-10 (×4): via INTRAVENOUS

## 2018-05-08 MED ORDER — SODIUM CHLORIDE 0.9 % IV BOLUS
1000.0000 mL | Freq: Once | INTRAVENOUS | Status: AC
  Administered 2018-05-08: 1000 mL via INTRAVENOUS

## 2018-05-08 MED ORDER — GI COCKTAIL ~~LOC~~
30.0000 mL | Freq: Three times a day (TID) | ORAL | Status: DC | PRN
  Administered 2018-05-08: 30 mL via ORAL
  Filled 2018-05-08 (×2): qty 30

## 2018-05-08 MED ORDER — IOPAMIDOL (ISOVUE-370) INJECTION 76%
100.0000 mL | Freq: Once | INTRAVENOUS | Status: AC | PRN
  Administered 2018-05-08: 100 mL via INTRAVENOUS

## 2018-05-08 MED ORDER — ONDANSETRON HCL 4 MG/2ML IJ SOLN: 4.0000 mg | Freq: Four times a day (QID) | INTRAMUSCULAR | Status: DC | PRN

## 2018-05-08 NOTE — ED Notes (Signed)
Attempted to call report

## 2018-05-08 NOTE — H&P (Signed)
History and Physical    Lilit Cinelli ZOX:[id number] DOB: 05-27-1946 DOA: 05/08/2018  PCP: Iona Beard, MD  Patient coming from: Home  Chief Complaint: Abdominal pain  HPI: Kim Reese is a 72 y.o. female with medical history significant of hypertension, hyperlipidemia comes in after suffering 1 week of epigastric abdominal pain that is radiating across her upper abdomen.  She denies any fever.  She is been feeling really tired.  She is been very nauseated but not vomiting.  No diarrhea.  She still has her gallbladder.  She does not drink alcohol regularly and she takes about 2000 mg of Tylenol a day.  Patient found to have significantly elevated LFTs and a mildly elevated alk phos and referred for admission for abnormal liver function tests.  Patient does not notice any association with eating.  Review of Systems: As per HPI otherwise 10 point review of systems negative.   Past Medical History:  Diagnosis Date  . Cataract   . Glaucoma   . Hyperlipidemia   . Hypertension   . Osteoarthritis     Past Surgical History:  Procedure Laterality Date  . CATARACT EXTRACTION W/PHACO  11/26/2011   Procedure: CATARACT EXTRACTION PHACO AND INTRAOCULAR LENS PLACEMENT (IOC);  Surgeon: Marylynn Pearson, MD;  Location: [place];  Service: Ophthalmology;  Laterality: Left;     reports that she quit smoking about 20 years ago. Her smoking use included cigarettes. She started smoking about 53 years ago. She smoked 1.50 packs per day. She has never used smokeless tobacco. She reports that she does not drink alcohol or use drugs.  Allergies  Allergen Reactions  . Penicillins Rash    Has patient had a PCN reaction causing immediate rash, facial/tongue/throat swelling, SOB or lightheadedness with hypotension: Yes Has patient had a PCN reaction causing severe rash involving mucus membranes or skin necrosis: No Has patient had a PCN reaction that required hospitalization: No Has patient had a PCN reaction  occurring within the last 10 years: No If all of the above answers are "NO", then may proceed with Cephalosporin use.     Family History  Problem Relation Age of Onset  . Hypertension Mother   . Cancer Father   . Other Sister        multiple myeloma  . Asthma Son     Prior to Admission medications   Medication Sig Start Date End Date Taking? Authorizing Provider  acetaminophen (TYLENOL) 650 MG CR tablet Take 1,300 mg by mouth 2 (two) times daily. For pain    Yes [provider]  amLODipine (NORVASC) 5 MG tablet Take 5 mg by mouth daily. **PATIENT IS TO VERIFY IF THIS MEDICATION IS STILL BEING TAKEN   Yes [provider]  aspirin EC 81 MG tablet Take 81 mg by mouth daily.   Yes [provider]  brimonidine-timolol (COMBIGAN) 0.2-0.5 % ophthalmic solution Place 1 drop into the right eye every 12 (twelve) hours.   Yes [provider]  brinzolamide (AZOPT) 1 % ophthalmic suspension Place 1 drop into the right eye 3 (three) times daily.   Yes [provider]  cycloSPORINE (RESTASIS) 0.05 % ophthalmic emulsion Place 1 drop into both eyes 2 (two) times daily.   Yes [provider]  latanoprost (XALATAN) 0.005 % ophthalmic solution Place 1 drop into both eyes at bedtime.  03/03/18  Yes [provider]  Multiple Vitamin (MULITIVITAMIN WITH MINERALS) TABS Take 1 tablet by mouth daily.   Yes [provider]  pravastatin (  PRAVACHOL) 20 MG tablet Take 20 mg by mouth at bedtime.    Yes [provider]  vitamin E 400 UNIT capsule Take 400 Units by mouth daily.   Yes [provider]    Physical Exam: Vitals:   05/08/18 1611 05/08/18 1713 05/08/18 1715 05/08/18 1725  BP:  125/75    Pulse:   87 74  Resp:      Temp:      TempSrc:      SpO2:   100% 100%  Weight: 49 kg     Height: '5\' 2"'$  (1.575 m)         Constitutional: NAD, calm, comfortable Vitals:   05/08/18 1611 05/08/18 1713 05/08/18 1715 05/08/18  1725  BP:  125/75    Pulse:   87 74  Resp:      Temp:      TempSrc:      SpO2:   100% 100%  Weight: 49 kg     Height: '5\' 2"'$  (1.575 m)      Eyes: PERRL, lids and conjunctivae normal ENMT: Mucous membranes are moist. Posterior pharynx clear of any exudate or lesions.Normal dentition.  Neck: normal, supple, no masses, no thyromegaly Respiratory: clear to auscultation bilaterally, no wheezing, no crackles. Normal respiratory effort. No accessory muscle use.  Cardiovascular: Regular rate and rhythm, no murmurs / rubs / gallops. No extremity edema. 2+ pedal pulses. No carotid bruits.  Abdomen: no tenderness, no masses palpated. No hepatosplenomegaly. Bowel sounds positive.  Musculoskeletal: no clubbing / cyanosis. No joint deformity upper and lower extremities. Good ROM, no contractures. Normal muscle tone.  Skin: no rashes, lesions, ulcers. No induration Neurologic: CN 2-12 grossly intact. Sensation intact, DTR normal. Strength 5/5 in all 4.  Psychiatric: Normal judgment and insight. Alert and oriented x 3. Normal mood.    Labs on Admission: I have personally reviewed following labs and imaging studies  CBC: Recent Labs  Lab 05/08/18 1709  WBC 5.2  HGB 11.8*  HCT 34.6*  MCV 96.1  PLT 734*   Basic Metabolic Panel: Recent Labs  Lab 05/08/18 1709  NA 134*  K 2.8*  CL 96*  CO2 26  GLUCOSE 103*  BUN 10  CREATININE 0.76  CALCIUM 8.2*   GFR: Estimated Creatinine Clearance: 48.4 mL/min (by C-G formula based on SCr of 0.76 mg/dL). Liver Function Tests: Recent Labs  Lab 05/08/18 1709  AST 2,836*  ALT 613*  ALKPHOS 168*  BILITOT 3.3*  PROT 6.8  ALBUMIN 2.8*   Recent Labs  Lab 05/08/18 1709  LIPASE 56*   No results for input(s): AMMONIA in the last 168 hours. Coagulation Profile: No results for input(s): INR, PROTIME in the last 168 hours. Cardiac Enzymes: No results for input(s): CKTOTAL, CKMB, CKMBINDEX, TROPONINI in the last 168 hours. BNP (last 3 results) No  results for input(s): PROBNP in the last 8760 hours. HbA1C: No results for input(s): HGBA1C in the last 72 hours. CBG: No results for input(s): GLUCAP in the last 168 hours. Lipid Profile: No results for input(s): CHOL, HDL, LDLCALC, TRIG, CHOLHDL, LDLDIRECT in the last 72 hours. Thyroid Function Tests: No results for input(s): TSH, T4TOTAL, FREET4, T3FREE, THYROIDAB in the last 72 hours. Anemia Panel: No results for input(s): VITAMINB12, FOLATE, FERRITIN, TIBC, IRON, RETICCTPCT in the last 72 hours. Urine analysis:    Component Value Date/Time   COLORURINE YELLOW 05/08/2018 1915   APPEARANCEUR HAZY (A) 05/08/2018 1915   LABSPEC 1.028 05/08/2018 1915   PHURINE 7.0 05/08/2018 1915  GLUCOSEU NEGATIVE 05/08/2018 1915   HGBUR SMALL (A) 05/08/2018 [place] NEGATIVE 05/08/2018 1915   KETONESUR 5 (A) 05/08/2018 1915   PROTEINUR NEGATIVE 05/08/2018 1915   NITRITE NEGATIVE 05/08/2018 1915   LEUKOCYTESUR LARGE (A) 05/08/2018 1915   Sepsis Labs: !!!!!!!!!!!!!!!!!!!!!!!!!!!!!!!!!!!!!!!!!!!! '@LABRCNTIP'$ (procalcitonin:4,lacticidven:4) )No results found for this or any previous visit (from the past 240 hour(s)).   Radiological Exams on Admission: Dg Chest 2 View  Result Date: 05/08/2018 CLINICAL DATA:  Chest pressure and epigastric abdominal pain today. Ex-smoker. EXAM: CHEST - 2 VIEW COMPARISON:  11/21/2011. FINDINGS: Normal sized heart. Tortuous aorta. Stable mild diffuse peribronchial thickening. Clear lungs. 1.4 cm nodular density overlying the posterior upper lung zones on the lateral view, not seen on the frontal view. Diffuse osteopenia, mild to moderate levoconvex scoliosis and thoracolumbar spine degenerative changes. Bilateral shoulder degenerative changes. IMPRESSION: 1. 1. 1.4 cm nodular density overlying the posterior upper lung zones on the lateral view. This could represent a lung nodule or a vessel viewed on end. Further evaluation with a chest CT with contrast is  recommended. If there is a clinical concern for pulmonary embolism, this could be performed as a chest CTA. 2. Stable mild chronic bronchitic changes compatible with the history of smoking. Electronically Signed   By: Claudie Revering M.D.   On: 05/08/2018 18:00   Ct Angio Abd/pel W/ And/or W/o  Result Date: 05/08/2018 CLINICAL DATA:  Abdominal pulsatile mass.  Aneurysm suspected. Patient c/o epigastric/upper abd pain that radiates into lower abd. Patient reports nausea and vomiting. Denies any diarrhea, fevers, or urinary symptoms. Patient states last BM was this morning-denies any blood in stool. Patient also reports generalized fatigue and dizziness with the abd pain. EXAM: CTA ABDOMEN AND PELVIS wITHOUT AND WITH CONTRAST TECHNIQUE: Multidetector CT imaging of the abdomen and pelvis was performed using the standard protocol during bolus administration of intravenous contrast. Multiplanar reconstructed images and MIPs were obtained and reviewed to evaluate the vascular anatomy. CONTRAST:  168m ISOVUE-370 IOPAMIDOL (ISOVUE-370) INJECTION 76% COMPARISON:  11/11/2017 FINDINGS: VASCULAR Aorta: Abdominal aorta is normal in caliber. No aneurysm. Mild atherosclerosis. No significant stenosis. No dissection. Celiac: Mild partly calcified plaque adjacent to the origin. No significant stenosis. SMA: Patent without evidence of aneurysm, dissection, vasculitis or significant stenosis. Renals: Mild plaque at both renal artery origins. No significant stenosis. There are small secondary renal arteries bilaterally. IMA: Patent with mild plaque at its origin. Inflow: Patent without evidence of aneurysm, dissection, vasculitis or significant stenosis. Proximal Outflow: Bilateral common femoral and visualized portions of the superficial and profunda femoral arteries are patent without evidence of aneurysm, dissection, vasculitis or significant stenosis. Veins: No obvious venous abnormality within the limitations of this arterial  phase study. Review of the MIP images confirms the above findings. NON-VASCULAR Lower chest: Emphysema.  No acute findings.  Heart normal size. Hepatobiliary: Decreased attenuation of the liver consistent with diffuse fatty infiltration. No liver mass or focal lesion. Gallbladder is unremarkable. No bile duct dilation. Pancreas: Unremarkable. No pancreatic ductal dilatation or surrounding inflammatory changes. Spleen: Normal in size without focal abnormality. Adrenals/Urinary Tract: No adrenal masses. Three point 3 cm right upper pole renal cyst. Area of apparent scarring from the upper pole of the left kidney. Small probable cyst from the lower pole the left kidney measuring 1 cm. No other renal masses or lesions. No stones. No hydronephrosis. Ureters are normal course and in caliber. Bladder is unremarkable. Stomach/Bowel: Stomach is unremarkable. Small bowel and colon are normal in caliber with  no wall thickening or inflammation. Normal appendix visualized. A loop of ileum extends into a right inguinal hernia without incarceration or strangulation. Lymphatic: No adenopathy. Reproductive: Uterus and adnexa are unremarkable. Other: No ascites.  No other hernias. Musculoskeletal: No acute fracture. No osteoblastic or osteolytic lesions. There are significant degenerative changes of the visualized spine. IMPRESSION: VASCULAR 1. Normal caliber abdominal aorta.  No aneurysm or dissection. 2. Mild aortic and branch vessel atherosclerosis no significant stenosis. NON-VASCULAR 1. No acute findings within the abdomen or pelvis. No findings to account for the patient's symptoms. 2. Extensive hepatic steatosis. Electronically Signed   By: Lajean Manes M.D.   On: 05/08/2018 19:10    Old chart reviewed Case discussed with Dr. Laverta Baltimore in the ED   Assessment/Plan 72 year old female with epigastric abdominal pain comes in with elevated LFTs of uncertain etiology Principal Problem:   Abdominal pain-abdominal exam is  benign.  Could be due to gallbladder disease versus passing a gallstone.  Give a liter of IV fluids and placed on IV fluids overnight.  Repeat LFTs in the morning.  Alk phos very mildly elevated lipase is pretty normal.  White count is also normal.  Acute hepatitis panel is pending.  Check Tylenol level.  Check ammonia level.  Check coags.  Dr. Melony Overly with GI service has been consulted and will see patient in the morning.  Will keep n.p.o. after midnight.  Active Problems:   Elevated LFTs-as above   Hypertension-hold blood pressure meds at this time     DVT prophylaxis: SCDs Code Status: Full Family Communication: Daughter Disposition Plan: 1 to 3 days Consults called: GI Admission status: Observation   DAVID,RACHAL A MD [facility]  If 7PM-7AM, please contact night-coverage [email or web address] Password [email or web address]  05/08/2018, 9:07 PM

## 2018-05-08 NOTE — ED Provider Notes (Signed)
Emergency Department Provider Note   I have reviewed the triage vital signs and the nursing notes.   HISTORY  Chief Complaint Abdominal Pain   HPI Kim Reese is a 72 y.o. female with PMH of HLD, HTN, and OA the emergency department for evaluation of abdominal pain radiating from the epigastric in a linear fashion to the lower abdomen.  She states she has had this pain intermittently for the last week and now has mainly lower abdominal discomfort.  She denies any fevers, chills, UTI symptoms, vomiting, diarrhea.  He is feeling fatigue and has some intermittent dizziness.  Patient did have a mechanical fall approximately 4 weeks ago with head trauma and states that she has had some intermittent, mild headache symptoms since that time.  She denies radiation of pain up into the chest.  No shortness of breath or palpitations.   Past Medical History:  Diagnosis Date  . Cataract   . Glaucoma   . Hyperlipidemia   . Hypertension   . Osteoarthritis     Patient Active Problem List   Diagnosis Date Noted  . Pap smear of cervix with ASCUS, cannot exclude HGSIL 01/31/2014  . Mild dysplasia of cervix 12/12/2013    Past Surgical History:  Procedure Laterality Date  . CATARACT EXTRACTION W/PHACO  11/26/2011   Procedure: CATARACT EXTRACTION PHACO AND INTRAOCULAR LENS PLACEMENT (IOC);  Surgeon: Marylynn Pearson, MD;  Location: [place];  Service: Ophthalmology;  Laterality: Left;    Allergies Penicillins  Family History  Problem Relation Age of Onset  . Hypertension Mother   . Cancer Father   . Other Sister        multiple myeloma  . Asthma Son     Social History Social History   Tobacco Use  . Smoking status: Former Smoker    Packs/day: 1.50    Types: Cigarettes    Start date: 10/15/1964    Last attempt to quit: 10/15/1997    Years since quitting: 20.5  . Smokeless tobacco: Never Used  Substance Use Topics  . Alcohol use: No  . Drug use: No    Review of  Systems  Constitutional: No fever/chills. Positive fatigue.  Eyes: No visual changes. ENT: No sore throat. Cardiovascular: Denies chest pain. Respiratory: Denies shortness of breath. Gastrointestinal: Positive abdominal pain.  No nausea, no vomiting.  No diarrhea.  No constipation. Genitourinary: Negative for dysuria. Musculoskeletal: Negative for back pain. Skin: Negative for rash. Neurological: Negative for headaches, focal weakness or numbness.  10-point ROS otherwise negative.  ____________________________________________   PHYSICAL EXAM:  VITAL SIGNS: ED Triage Vitals  Enc Vitals Group     BP 05/08/18 1609 97/69     Pulse Rate 05/08/18 1609 (!) 106     Resp 05/08/18 1609 18     Temp 05/08/18 1609 98.1 F (36.7 C)     Temp Source 05/08/18 1609 Oral     SpO2 05/08/18 1609 100 %     Weight 05/08/18 1611 108 lb (49 kg)     Height 05/08/18 1611 '5\' 2"'$  (1.575 m)     Pain Score 05/08/18 1610 10   Constitutional: Alert and oriented. Well appearing and in no acute distress. Eyes: Conjunctivae are normal. Head: Atraumatic. Nose: No congestion/rhinnorhea. Mouth/Throat: Mucous membranes are moist.  Neck: No stridor.  Cardiovascular: Normal rate, regular rhythm. Good peripheral circulation. Grossly normal heart sounds.   Respiratory: Normal respiratory effort.  No retractions. Lungs CTAB. Gastrointestinal: Soft with mild focal epigastric abdominal tenderness. No rebound or  guarding. No distention.  Musculoskeletal: No lower extremity tenderness nor edema. No gross deformities of extremities. Neurologic:  Normal speech and language. No gross focal neurologic deficits are appreciated.  Skin:  Skin is warm, dry and intact. No rash noted.  ____________________________________________   LABS (all labs ordered are listed, but only abnormal results are displayed)  Labs Reviewed  LIPASE, BLOOD - Abnormal; Notable for the following components:      Result Value   Lipase 56 (*)     All other components within normal limits  COMPREHENSIVE METABOLIC PANEL - Abnormal; Notable for the following components:   Sodium 134 (*)    Potassium 2.8 (*)    Chloride 96 (*)    Glucose, Bld 103 (*)    Calcium 8.2 (*)    Albumin 2.8 (*)    AST 2,836 (*)    ALT 613 (*)    Alkaline Phosphatase 168 (*)    Total Bilirubin 3.3 (*)    All other components within normal limits  CBC - Abnormal; Notable for the following components:   RBC 3.60 (*)    Hemoglobin 11.8 (*)    HCT 34.6 (*)    Platelets 143 (*)    All other components within normal limits  URINALYSIS, ROUTINE W REFLEX MICROSCOPIC - Abnormal; Notable for the following components:   APPearance HAZY (*)    Hgb urine dipstick SMALL (*)    Ketones, ur 5 (*)    Leukocytes, UA LARGE (*)    Bacteria, UA RARE (*)    Non Squamous Epithelial 0-5 (*)    All other components within normal limits  ACETAMINOPHEN LEVEL  PROTIME-INR  AMMONIA  HEPATITIS PANEL, ACUTE  ETHANOL  I-STAT TROPONIN, ED   ____________________________________________  EKG   EKG Interpretation  Date/Time:  Saturday May 08 2018 16:10:01 EDT Ventricular Rate:  109 PR Interval:  144 QRS Duration: 66 QT Interval:  320 QTC Calculation: 430 R Axis:   73 Text Interpretation:  Sinus tachycardia Anteroseptal infarct , age undetermined ST & T wave abnormality, consider inferior ischemia Abnormal ECG No STEMI. Similar to prior.  Confirmed by Nanda Quinton [phone]) on 05/08/2018 4:16:01 PM       ____________________________________________  RADIOLOGY  Dg Chest 2 View  Result Date: 05/08/2018 CLINICAL DATA:  Chest pressure and epigastric abdominal pain today. Ex-smoker. EXAM: CHEST - 2 VIEW COMPARISON:  11/21/2011. FINDINGS: Normal sized heart. Tortuous aorta. Stable mild diffuse peribronchial thickening. Clear lungs. 1.4 cm nodular density overlying the posterior upper lung zones on the lateral view, not seen on the frontal view. Diffuse osteopenia, mild  to moderate levoconvex scoliosis and thoracolumbar spine degenerative changes. Bilateral shoulder degenerative changes. IMPRESSION: 1. 1. 1.4 cm nodular density overlying the posterior upper lung zones on the lateral view. This could represent a lung nodule or a vessel viewed on end. Further evaluation with a chest CT with contrast is recommended. If there is a clinical concern for pulmonary embolism, this could be performed as a chest CTA. 2. Stable mild chronic bronchitic changes compatible with the history of smoking. Electronically Signed   By: Claudie Revering M.D.   On: 05/08/2018 18:00   Ct Angio Abd/pel W/ And/or W/o  Result Date: 05/08/2018 CLINICAL DATA:  Abdominal pulsatile mass.  Aneurysm suspected. Patient c/o epigastric/upper abd pain that radiates into lower abd. Patient reports nausea and vomiting. Denies any diarrhea, fevers, or urinary symptoms. Patient states last BM was this morning-denies any blood in stool. Patient also reports generalized fatigue  and dizziness with the abd pain. EXAM: CTA ABDOMEN AND PELVIS wITHOUT AND WITH CONTRAST TECHNIQUE: Multidetector CT imaging of the abdomen and pelvis was performed using the standard protocol during bolus administration of intravenous contrast. Multiplanar reconstructed images and MIPs were obtained and reviewed to evaluate the vascular anatomy. CONTRAST:  161m ISOVUE-370 IOPAMIDOL (ISOVUE-370) INJECTION 76% COMPARISON:  11/11/2017 FINDINGS: VASCULAR Aorta: Abdominal aorta is normal in caliber. No aneurysm. Mild atherosclerosis. No significant stenosis. No dissection. Celiac: Mild partly calcified plaque adjacent to the origin. No significant stenosis. SMA: Patent without evidence of aneurysm, dissection, vasculitis or significant stenosis. Renals: Mild plaque at both renal artery origins. No significant stenosis. There are small secondary renal arteries bilaterally. IMA: Patent with mild plaque at its origin. Inflow: Patent without evidence of  aneurysm, dissection, vasculitis or significant stenosis. Proximal Outflow: Bilateral common femoral and visualized portions of the superficial and profunda femoral arteries are patent without evidence of aneurysm, dissection, vasculitis or significant stenosis. Veins: No obvious venous abnormality within the limitations of this arterial phase study. Review of the MIP images confirms the above findings. NON-VASCULAR Lower chest: Emphysema.  No acute findings.  Heart normal size. Hepatobiliary: Decreased attenuation of the liver consistent with diffuse fatty infiltration. No liver mass or focal lesion. Gallbladder is unremarkable. No bile duct dilation. Pancreas: Unremarkable. No pancreatic ductal dilatation or surrounding inflammatory changes. Spleen: Normal in size without focal abnormality. Adrenals/Urinary Tract: No adrenal masses. Three point 3 cm right upper pole renal cyst. Area of apparent scarring from the upper pole of the left kidney. Small probable cyst from the lower pole the left kidney measuring 1 cm. No other renal masses or lesions. No stones. No hydronephrosis. Ureters are normal course and in caliber. Bladder is unremarkable. Stomach/Bowel: Stomach is unremarkable. Small bowel and colon are normal in caliber with no wall thickening or inflammation. Normal appendix visualized. A loop of ileum extends into a right inguinal hernia without incarceration or strangulation. Lymphatic: No adenopathy. Reproductive: Uterus and adnexa are unremarkable. Other: No ascites.  No other hernias. Musculoskeletal: No acute fracture. No osteoblastic or osteolytic lesions. There are significant degenerative changes of the visualized spine. IMPRESSION: VASCULAR 1. Normal caliber abdominal aorta.  No aneurysm or dissection. 2. Mild aortic and branch vessel atherosclerosis no significant stenosis. NON-VASCULAR 1. No acute findings within the abdomen or pelvis. No findings to account for the patient's symptoms. 2.  Extensive hepatic steatosis. Electronically Signed   By: DLajean ManesM.D.   On: 05/08/2018 19:10    ____________________________________________   PROCEDURES  Procedure(s) performed:   Procedures  None ____________________________________________   INITIAL IMPRESSION / ASSESSMENT AND PLAN / ED COURSE  Pertinent labs & imaging results that were available during my care of the patient were reviewed by me and considered in my medical decision making (see chart for details).  Patient presents to the emergency department with linear abdominal pain radiating from the epigastrium down to the lower abdomen.  No UTI symptoms.  Patient has several risk factors for aortic pathology.  She had a CT scan in February which showed no AAA.  Likely of lower suspicion for dissection versus a rapidly developing AAA but given her radiation of pain in severity plan for CT of the abdomen and pelvis with IV contrast to rule this out.   07:39 PM CT results resulted and reviewed.  No acute findings to explain the patient's symptoms.  She does have a significant elevation in her AST specifically but ALT is also elevated.  Bilirubin 3.3.  No pain at this time.  Normal gallbladder on CT.  No biliary dilation appreciated on CT.  Ultrasound not available to me at this time.  Reviewed these results the patient tells me that her primary care physician found elevated liver enzymes on labs recently and she is due to have repeat blood work but has not seen a GI specialist.  She tells me that she drinks "now and then" but denies heavy drinking. Does also take no more than 2000 mg of Tylenol daily. Otherwise only with mild hypokalemia. Will discuss results with GI.   08:50 PM Spoke with Dr. Gala Romney to review the case. Plan for Tylenol, Ammonia, INR, and acute hepatitis panel. Suspects this is more than EtOH and given age would prefer obs at [facility] and he will consult in the AM.   Discussed patient's case with  Hospitalist, Dr. Shanon Brow to request admission. Patient and family (if present) updated with plan. Care transferred to Hospitalist service.  I reviewed all nursing notes, vitals, pertinent old records, EKGs, labs, imaging (as available).  ____________________________________________  FINAL CLINICAL IMPRESSION(S) / ED DIAGNOSES  Final diagnoses:  Elevated LFTs  Lower abdominal pain     MEDICATIONS GIVEN DURING THIS VISIT:  Medications  potassium chloride SA (K-DUR,KLOR-CON) CR tablet 40 mEq (has no administration in time range)  sodium chloride 0.9 % bolus 500 mL (0 mLs Intravenous Stopped 05/08/18 1737)  iopamidol (ISOVUE-370) 76 % injection 100 mL (100 mLs Intravenous Contrast Given 05/08/18 1822)    Note:  This document was prepared using Dragon voice recognition software and may include unintentional dictation errors.  Nanda Quinton, MD Emergency Medicine    Ronalee Scheunemann, [facility], MD 05/08/18 2119

## 2018-05-08 NOTE — ED Triage Notes (Addendum)
Patient c/o epigastric/upper abd pain that radiates into lower abd. Patient reports nausea and vomiting. Denies any diarrhea, fevers, or urinary symptoms. Patient states last BM was this morning-denies any blood in stool. Patient also reports generalized fatigue and dizziness with the abd pain.

## 2018-05-09 ENCOUNTER — Observation Stay (HOSPITAL_COMMUNITY): Payer: PPO

## 2018-05-09 DIAGNOSIS — E876 Hypokalemia: Secondary | ICD-10-CM | POA: Diagnosis not present

## 2018-05-09 DIAGNOSIS — K760 Fatty (change of) liver, not elsewhere classified: Secondary | ICD-10-CM | POA: Diagnosis not present

## 2018-05-09 DIAGNOSIS — F1010 Alcohol abuse, uncomplicated: Secondary | ICD-10-CM | POA: Diagnosis present

## 2018-05-09 DIAGNOSIS — E785 Hyperlipidemia, unspecified: Secondary | ICD-10-CM | POA: Diagnosis not present

## 2018-05-09 DIAGNOSIS — K7010 Alcoholic hepatitis without ascites: Secondary | ICD-10-CM | POA: Diagnosis not present

## 2018-05-09 DIAGNOSIS — D649 Anemia, unspecified: Secondary | ICD-10-CM | POA: Diagnosis not present

## 2018-05-09 DIAGNOSIS — R945 Abnormal results of liver function studies: Secondary | ICD-10-CM | POA: Diagnosis not present

## 2018-05-09 DIAGNOSIS — R1013 Epigastric pain: Secondary | ICD-10-CM | POA: Diagnosis not present

## 2018-05-09 DIAGNOSIS — I10 Essential (primary) hypertension: Secondary | ICD-10-CM

## 2018-05-09 LAB — CBC
HCT: 31.1 % — ABNORMAL LOW (ref 36.0–46.0)
HEMOGLOBIN: 10.6 g/dL — AB (ref 12.0–15.0)
MCH: 32.5 pg (ref 26.0–34.0)
MCHC: 34.1 g/dL (ref 30.0–36.0)
MCV: 95.4 fL (ref 78.0–100.0)
PLATELETS: 141 10*3/uL — AB (ref 150–400)
RBC: 3.26 MIL/uL — ABNORMAL LOW (ref 3.87–5.11)
RDW: 14.8 % (ref 11.5–15.5)
WBC: 4.3 10*3/uL (ref 4.0–10.5)

## 2018-05-09 LAB — COMPREHENSIVE METABOLIC PANEL
ALK PHOS: 144 U/L — AB (ref 38–126)
ALT: 368 U/L — AB (ref 0–44)
ANION GAP: 9 (ref 5–15)
AST: 1206 U/L — ABNORMAL HIGH (ref 15–41)
Albumin: 2.2 g/dL — ABNORMAL LOW (ref 3.5–5.0)
BILIRUBIN TOTAL: 2.2 mg/dL — AB (ref 0.3–1.2)
BUN: <5 mg/dL — ABNORMAL LOW (ref 8–23)
CALCIUM: 7.4 mg/dL — AB (ref 8.9–10.3)
CO2: 25 mmol/L (ref 22–32)
CREATININE: 0.57 mg/dL (ref 0.44–1.00)
Chloride: 104 mmol/L (ref 98–111)
GFR calc Af Amer: >60 mL/min (ref >60)
GFR calc non Af Amer: >60 mL/min (ref >60)
Glucose, Bld: 87 mg/dL (ref 70–99)
Potassium: 3 mmol/L — ABNORMAL LOW (ref 3.5–5.1)
Sodium: 138 mmol/L (ref 135–145)
TOTAL PROTEIN: 5.4 g/dL — AB (ref 6.5–8.1)

## 2018-05-09 MED ORDER — PANTOPRAZOLE SODIUM 40 MG PO TBEC
40.0000 mg | DELAYED_RELEASE_TABLET | Freq: Every day | ORAL | Status: DC
  Administered 2018-05-09 – 2018-05-10 (×2): 40 mg via ORAL
  Filled 2018-05-09 (×2): qty 1

## 2018-05-09 MED ORDER — POTASSIUM CHLORIDE CRYS ER 20 MEQ PO TBCR
40.0000 meq | EXTENDED_RELEASE_TABLET | ORAL | Status: AC
  Administered 2018-05-09 (×2): 40 meq via ORAL
  Filled 2018-05-09 (×2): qty 2

## 2018-05-09 MED ORDER — VITAMIN K1 10 MG/ML IJ SOLN
10.0000 mg | Freq: Once | INTRAMUSCULAR | Status: AC
  Administered 2018-05-09: 10 mg via SUBCUTANEOUS
  Filled 2018-05-09: qty 1

## 2018-05-09 NOTE — Consult Note (Signed)
Referring Provider: Kathie Dike, MD Primary Care Physician:  Iona Beard, MD Primary Gastroenterologist:  Dr. Laural Golden  Reason for Consultation:    Abnormal LFTs.  HPI:   Patient is 72 year old Afro-American female who has not been feeling well for at least 7 months.  She has been gradually losing weight and she has not had a good appetite.  She has been having falling episodes.  She had abdominopelvic CT for left upper quadrant abdominal pain back in February 2019 and it revealed fatty liver.  She has been having falling episodes until her antihypertensive was discontinued and she has not fallen in 3 weeks.  She also has felt dizzy weak and has had poor appetite.  She was nauseated yesterday and he few times but nothing came out.  According to her daughter she has lost 30 pounds this year.  For the past few days she has been having pain across her upper abdomen described as sharp pain.  She states she did not have any pain yesterday when she came to the emergency room.  On evaluation she was noted to have market elevation of AST and ALT.  I was contacted and recommended that acetaminophen level be checked along with viral markers.  She was hospitalized for further management. According to her daughter she used to drink too much alcohol over 20 years ago but then she quit.  She never required trip to emergency room had acute illness.  Over the last few years she has been drinking on a regular basis.  She states she drinks rum every other day.  She says she only takes a couple of drinks.  She never drinks in the presence of other people.  She states she has not had any alcohol in 3 weeks.  He did have blood work in December 2018 by Dr. Berdine Addison and was told her liver tests were mildly elevated. Patient denies heartburn dysphagia melena or rectal bleeding.  She is prone to constipation.  She takes MiraLAX daily or every other day and it is working.  He had colonoscopy in March 2011 revealing single  diverticulum and small external hemorrhoids. Review of the systems is negative for fever or chills. She has been taking Tylenol daily for musculoskeletal or joint pains.  Patient is retired.  She is widowed.  She has 4 children.  They are in good health.  Her son and daughter are at bedside.  She does not smoke cigarettes.  Her alcohol history is reviewed above.  Mother was hypertensive and lived to be 78. Father had cancer of the esophagus and died within few months of diagnosis at age 4. She lost one sister because of end-stage renal disease at age 45.  She has 5 sisters living and one has been treated for multiple myeloma and is in remission.  She is in her 46s.   Past Medical History:  Diagnosis Date  . Cataract   . Glaucoma   . Hyperlipidemia   . Hypertension   . Osteoarthritis     Past Surgical History:  Procedure Laterality Date  . CATARACT EXTRACTION W/PHACO  11/26/2011   Procedure: CATARACT EXTRACTION PHACO AND INTRAOCULAR LENS PLACEMENT (IOC);  Surgeon: Marylynn Pearson, MD;  Location: [facility];  Service: Ophthalmology;  Laterality: Left;    Prior to Admission medications   Medication Sig Start Date End Date Taking? Authorizing Provider  acetaminophen (TYLENOL) 650 MG CR tablet Take 1,300 mg by mouth 2 (two) times daily. For pain    Yes [provider]  amLODipine (NORVASC) 5 MG tablet Take 5 mg by mouth daily. **PATIENT IS TO VERIFY IF THIS MEDICATION IS STILL BEING TAKEN   Yes [provider]  aspirin EC 81 MG tablet Take 81 mg by mouth daily.   Yes [provider]  brimonidine-timolol (COMBIGAN) 0.2-0.5 % ophthalmic solution Place 1 drop into the right eye every 12 (twelve) hours.   Yes [provider]  brinzolamide (AZOPT) 1 % ophthalmic suspension Place 1 drop into the right eye 3 (three) times daily.   Yes [provider]  cycloSPORINE (RESTASIS) 0.05 % ophthalmic emulsion Place 1 drop into both eyes 2 (two) times daily.   Yes  [provider]  latanoprost (XALATAN) 0.005 % ophthalmic solution Place 1 drop into both eyes at bedtime.  03/03/18  Yes [provider]  Multiple Vitamin (MULITIVITAMIN WITH MINERALS) TABS Take 1 tablet by mouth daily.   Yes [provider]  pravastatin (PRAVACHOL) 20 MG tablet Take 20 mg by mouth at bedtime.    Yes [provider]  vitamin E 400 UNIT capsule Take 400 Units by mouth daily.   Yes [provider]    Current Facility-Administered Medications  Medication Dose Route Frequency Provider Last Rate Last Dose  . 0.9 %  sodium chloride infusion   Intravenous Continuous Derrill Kay A, MD 100 mL/hr at 05/09/18 1152    . gi cocktail (Maalox,Lidocaine,Donnatal)  30 mL Oral TID PRN Phillips Grout, MD   30 mL at 05/08/18 2303  . ondansetron (ZOFRAN) tablet 4 mg  4 mg Oral Q6H PRN Phillips Grout, MD       Or  . ondansetron [facility]) injection 4 mg  4 mg Intravenous Q6H PRN Phillips Grout, MD        Allergies as of 05/08/2018 - Review Complete 05/08/2018  Allergen Reaction Noted  . Penicillins Rash 11/18/2011    Family History  Problem Relation Age of Onset  . Hypertension Mother   . Cancer Father   . Other Sister        multiple myeloma  . Asthma Son     Social History   Socioeconomic History  . Marital status: Widowed    Spouse name: Not on file  . Number of children: Not on file  . Years of education: Not on file  . Highest education level: Not on file  Occupational History  . Not on file  Social Needs  . Financial resource strain: Not on file  . Food insecurity:    Worry: Not on file    Inability: Not on file  . Transportation needs:    Medical: Not on file    Non-medical: Not on file  Tobacco Use  . Smoking status: Former Smoker    Packs/day: 1.50    Types: Cigarettes    Start date: 10/15/1964    Last attempt to quit: 10/15/1997    Years since quitting: 20.5  . Smokeless tobacco: Never Used  Substance and Sexual  Activity  . Alcohol use: Yes    Comment: once or twice a week per PT  . Drug use: No  . Sexual activity: Yes  Lifestyle  . Physical activity:    Days per week: Not on file    Minutes per session: Not on file  . Stress: Not on file  Relationships  . Social connections:    Talks on phone: Not on file    Gets together: Not on file    Attends  religious service: Not on file    Active member of club or organization: Not on file    Attends meetings of clubs or organizations: Not on file    Relationship status: Not on file  . Intimate partner violence:    Fear of current or ex partner: Not on file    Emotionally abused: Not on file    Physically abused: Not on file    Forced sexual activity: Not on file  Other Topics Concern  . Not on file  Social History Narrative  . Not on file    Review of Systems: See HPI, otherwise normal ROS  Physical Exam: Temp:  [98.1 F (36.7 C)-98.4 F (36.9 C)] 98.4 F (36.9 C) (08/10 2237) Pulse Rate:  [74-106] 90 (08/10 2237) Resp:  [16-18] 16 (08/10 2237) BP: (97-125)/(69-82) 124/82 (08/10 2237) SpO2:  [100 %] 100 % (08/10 2237) Weight:  [49 kg-51.6 kg] 51.6 kg (08/11 0657) Last BM Date: 05/08/18   Patient is alert and in no acute distress. She does not have tremors or asterixis. Conjunctiva is pink.  Sclerae mildly icteric. Oral pharyngeal mucosa is normal.  She has partial upper and lower plate. Neck masses or thyromegaly noted. Cardiac exam with regular rhythm normal S1 and S2.  No murmur or gallop noted. Lungs are clear to auscultation. Abdomen is symmetrical.  Bowel sounds are normal.  On palpation it is soft and nontender.  Liver edge is palpable 3 cm below RCM.  It is somewhat firm but not tender.  Spleen is not palpable. He does not have clubbing or peripheral edema but she does have a mild peeling of skin of her palms.    Lab Results: Recent Labs    05/08/18 1709 05/09/18 0621  WBC 5.2 4.3  HGB 11.8* 10.6*  HCT 34.6* 31.1*   PLT 143* 141*   BMET Recent Labs    05/08/18 1709 05/09/18 0621  NA 134* 138  K 2.8* 3.0*  CL 96* 104  CO2 26 25  GLUCOSE 103* 87  BUN 10 <5*  CREATININE 0.76 0.57  CALCIUM 8.2* 7.4*   LFT Recent Labs    05/09/18 0621  PROT 5.4*  ALBUMIN 2.2*  AST 1,206*  ALT 368*  ALKPHOS 144*  BILITOT 2.2*   PT/INR Recent Labs    05/08/18 2111  LABPROT 18.0*  INR 1.50   Lab data from 09/01/2017 also reviewed. Bilirubin 1.0, AP 72, AST 115 and ALT 45.  Albumin was 3.9.   Studies/Results: Dg Chest 2 View  Result Date: 05/08/2018 CLINICAL DATA:  Chest pressure and epigastric abdominal pain today. Ex-smoker. EXAM: CHEST - 2 VIEW COMPARISON:  11/21/2011. FINDINGS: Normal sized heart. Tortuous aorta. Stable mild diffuse peribronchial thickening. Clear lungs. 1.4 cm nodular density overlying the posterior upper lung zones on the lateral view, not seen on the frontal view. Diffuse osteopenia, mild to moderate levoconvex scoliosis and thoracolumbar spine degenerative changes. Bilateral shoulder degenerative changes. IMPRESSION: 1. 1. 1.4 cm nodular density overlying the posterior upper lung zones on the lateral view. This could represent a lung nodule or a vessel viewed on end. Further evaluation with a chest CT with contrast is recommended. If there is a clinical concern for pulmonary embolism, this could be performed as a chest CTA. 2. Stable mild chronic bronchitic changes compatible with the history of smoking. Electronically Signed   By: Claudie Revering M.D.   On: 05/08/2018 18:00   Ct Angio Abd/pel W/ And/or W/o  Result Date: 05/08/2018 CLINICAL DATA:  Abdominal pulsatile mass.  Aneurysm suspected. Patient c/o epigastric/upper abd pain that radiates into lower abd. Patient reports nausea and vomiting. Denies any diarrhea, fevers, or urinary symptoms. Patient states last BM was this morning-denies any blood in stool. Patient also reports generalized fatigue and dizziness with the abd pain.  EXAM: CTA ABDOMEN AND PELVIS wITHOUT AND WITH CONTRAST TECHNIQUE: Multidetector CT imaging of the abdomen and pelvis was performed using the standard protocol during bolus administration of intravenous contrast. Multiplanar reconstructed images and MIPs were obtained and reviewed to evaluate the vascular anatomy. CONTRAST:  142m ISOVUE-370 IOPAMIDOL (ISOVUE-370) INJECTION 76% COMPARISON:  11/11/2017 FINDINGS: VASCULAR Aorta: Abdominal aorta is normal in caliber. No aneurysm. Mild atherosclerosis. No significant stenosis. No dissection. Celiac: Mild partly calcified plaque adjacent to the origin. No significant stenosis. SMA: Patent without evidence of aneurysm, dissection, vasculitis or significant stenosis. Renals: Mild plaque at both renal artery origins. No significant stenosis. There are small secondary renal arteries bilaterally. IMA: Patent with mild plaque at its origin. Inflow: Patent without evidence of aneurysm, dissection, vasculitis or significant stenosis. Proximal Outflow: Bilateral common femoral and visualized portions of the superficial and profunda femoral arteries are patent without evidence of aneurysm, dissection, vasculitis or significant stenosis. Veins: No obvious venous abnormality within the limitations of this arterial phase study. Review of the MIP images confirms the above findings. NON-VASCULAR Lower chest: Emphysema.  No acute findings.  Heart normal size. Hepatobiliary: Decreased attenuation of the liver consistent with diffuse fatty infiltration. No liver mass or focal lesion. Gallbladder is unremarkable. No bile duct dilation. Pancreas: Unremarkable. No pancreatic ductal dilatation or surrounding inflammatory changes. Spleen: Normal in size without focal abnormality. Adrenals/Urinary Tract: No adrenal masses. Three point 3 cm right upper pole renal cyst. Area of apparent scarring from the upper pole of the left kidney. Small probable cyst from the lower pole the left kidney  measuring 1 cm. No other renal masses or lesions. No stones. No hydronephrosis. Ureters are normal course and in caliber. Bladder is unremarkable. Stomach/Bowel: Stomach is unremarkable. Small bowel and colon are normal in caliber with no wall thickening or inflammation. Normal appendix visualized. A loop of ileum extends into a right inguinal hernia without incarceration or strangulation. Lymphatic: No adenopathy. Reproductive: Uterus and adnexa are unremarkable. Other: No ascites.  No other hernias. Musculoskeletal: No acute fracture. No osteoblastic or osteolytic lesions. There are significant degenerative changes of the visualized spine. IMPRESSION: VASCULAR 1. Normal caliber abdominal aorta.  No aneurysm or dissection. 2. Mild aortic and branch vessel atherosclerosis no significant stenosis. NON-VASCULAR 1. No acute findings within the abdomen or pelvis. No findings to account for the patient's symptoms. 2. Extensive hepatic steatosis. Electronically Signed   By: DLajean ManesM.D.   On: 05/08/2018 19:10   [place]Abdomen Limited Ruq  Result Date: 05/09/2018 CLINICAL DATA:  Elevated liver function tests. EXAM: ULTRASOUND ABDOMEN LIMITED RIGHT UPPER QUADRANT COMPARISON:  CT, 05/08/2018 FINDINGS: Gallbladder: No gallbladder wall thickening. Wall measures under 3 mm. No pericholecystic fluid. No gallstones. Common bile duct: Diameter: 5 mm Liver: Increased echogenicity with decreased through transmission of the sound beam consistent with fatty infiltration. Focal fatty sparing adjacent to the gallbladder. No mass or focal lesion. Portal vein is patent on color Doppler imaging with normal direction of blood flow towards the liver. IMPRESSION: 1. No evidence of acute cholecystitis. 2. Hepatic steatosis. Electronically Signed   By: DLajean ManesM.D.   On: 05/09/2018 13:13   Current imaging studies reviewed.  Abdominopelvic CT from  11/11/2017 also reviewed. It showed diffuse fatty infiltration of the liver and  simple renal cysts.   Assessment;  Patient is 72 year old Afro-American female who presented to emergency room with anorexia weight loss nausea heaving intermittent upper abdominal pain of few days duration and dizziness.  On evaluation in the emergency room she was noted to have markedly elevated transaminases.  AST was 2836 and ALT was 316 with bilirubin of 3.3 and alkaline phosphatase of 3.3.  CTA abdomen and pelvis revealed marked hepatic steatosis but no evidence of dilated bile duct or pancreatic abnormality.  Transaminases have decreased significantly and ultrasound is negative for cholelithiasis.  Acetaminophen level less than 10. Review of her prior studies revealed ALT of 115 and ALT of 45 in December 2018 and CT from February 2019 also revealed fatty liver. It appears patient has alcoholic hepatitis dating back to December 2018 but current presentation is due to super added injury be it acetaminophen pravastatin or she could also passed a small stone.  Acetaminophen level is is low and her transaminases have more than halved.  Therefore there is no indication for therapy with Mucomyst. Her hepatitis discriminant function is 30.9.  Will recalculated in a.m. All of her other symptoms are possibly due to her liver disease.  Anemia.  No history of GI bleed.  Last colonoscopy was in March 2011 and was unremarkable.  Anemia may be due to malnutrition.  Will rule out iron B12 or folate deficiency.  Will check serum ferritin but it may not be accurate marker for iron storage as it may be elevated given her liver disease.   Recommendations;  Vit K 10 mg SQ x1. Hold acetaminophen for now. Repeat LFTs in a.m. along with INR. We will check serum iron TIBC ferritin B12 and folate levels(ferritin may be high due to alcoholic hepatitis). Hemoccult x1.   LOS: 0 days   Najeeb Rehman  05/09/2018, 3:41 PM

## 2018-05-09 NOTE — Progress Notes (Signed)
PROGRESS NOTE    Kim Reese  ZOX:[id number] DOB: 1946-03-08 DOA: 05/08/2018 PCP: Mirna Mires, MD    Brief Narrative:  72 year old female is brought to the hospital with a one-week history of epigastric abdominal pain.  She was noted to have significantly elevated LFTs.  She had been taking Tylenol recently and also drinks alcohol.  Imaging did not indicate any gallstones, although she may have passed one already.  LFTs are trending down.  GI following.   Assessment & Plan:   Principal Problem:   Abdominal pain Active Problems:   Essential hypertension   Elevated LFTs   Alcohol abuse   Hyperlipidemia   Hypokalemia   1. Elevated liver enzymes.  Patient has a history of alcohol use.  The exact etiology of her elevated enzymes and isn't entirely clear.  She may have passed a gallstone.  She is also on a statin and has been taking Tylenol.  Tylenol level was noted to be negative.  Viral hepatitis panel is in process.  Gastroenterology following.  Liver enzymes have significantly improved overnight.  This will be rechecked in the morning.  Clinically she appears to be stable.  She is not having any fevers.  Continue to monitor. 2. Hyperlipidemia.  Statin currently on hold due to elevated LFTs. 3. Hypertension.  She is on Norvasc.  This is currently on hold.  She is normotensive. 4. Alcohol abuse.  Counseled on the importance of alcohol cessation.  No signs of alcohol withdrawal at this time. 5. Hypokalemia.  Replace.  Check magnesium   DVT prophylaxis: SCDs Code Status: Full code Family Communication: Multiple family members at bedside Disposition Plan: Discharge home once improved   Consultants:   Gastroenterology  Procedures:     Antimicrobials:      Subjective: She is feeling better.  No nausea or vomiting.  She did have some heartburn that improved with GI cocktail.  Objective: Vitals:   05/08/18 1725 05/08/18 2200 05/08/18 2237 05/09/18 0657  BP:  123/76  124/82   Pulse: 74 80 90   Resp:   16   Temp:   98.4 F (36.9 C)   TempSrc:   Oral   SpO2: 100% 100% 100%   Weight:   50.3 kg 51.6 kg  Height:   5\' 2"  (1.575 m)     Intake/Output Summary (Last 24 hours) at 05/09/2018 1852 Last data filed at 05/09/2018 1800 Gross per 24 hour  Intake 2565 ml  Output 550 ml  Net 2015 ml   Filed Weights   05/08/18 1611 05/08/18 2237 05/09/18 0657  Weight: 49 kg 50.3 kg 51.6 kg    Examination:  General exam: Appears calm and comfortable  Respiratory system: Clear to auscultation. Respiratory effort normal. Cardiovascular system: S1 & S2 heard, RRR. No JVD, murmurs, rubs, gallops or clicks. No pedal edema. Gastrointestinal system: Abdomen is nondistended, soft and nontender. No organomegaly or masses felt. Normal bowel sounds heard. Central nervous system: Alert and oriented. No focal neurological deficits. Extremities: Symmetric 5 x 5 power. Skin: No rashes, lesions or ulcers Psychiatry: Judgement and insight appear normal. Mood & affect appropriate.     Data Reviewed: I have personally reviewed following labs and imaging studies  CBC: Recent Labs  Lab 05/08/18 1709 05/09/18 0621  WBC 5.2 4.3  HGB 11.8* 10.6*  HCT 34.6* 31.1*  MCV 96.1 95.4  PLT 143* 141*   Basic Metabolic Panel: Recent Labs  Lab 05/08/18 1709 05/09/18 0621  NA 134* 138  K 2.8* 3.0*  CL 96* 104  CO2 26 25  GLUCOSE 103* 87  BUN 10 <5*  CREATININE 0.76 0.57  CALCIUM 8.2* 7.4*   GFR: Estimated Creatinine Clearance: 49.5 mL/min (by C-G formula based on SCr of 0.57 mg/dL). Liver Function Tests: Recent Labs  Lab 05/08/18 1709 05/09/18 0621  AST 2,836* 1,206*  ALT 613* 368*  ALKPHOS 168* 144*  BILITOT 3.3* 2.2*  PROT 6.8 5.4*  ALBUMIN 2.8* 2.2*   Recent Labs  Lab 05/08/18 1709  LIPASE 56*   Recent Labs  Lab 05/08/18 2111  AMMONIA 17   Coagulation Profile: Recent Labs  Lab 05/08/18 2111  INR 1.50   Cardiac Enzymes: No results for  input(s): CKTOTAL, CKMB, CKMBINDEX, TROPONINI in the last 168 hours. BNP (last 3 results) No results for input(s): PROBNP in the last 8760 hours. HbA1C: No results for input(s): HGBA1C in the last 72 hours. CBG: No results for input(s): GLUCAP in the last 168 hours. Lipid Profile: No results for input(s): CHOL, HDL, LDLCALC, TRIG, CHOLHDL, LDLDIRECT in the last 72 hours. Thyroid Function Tests: No results for input(s): TSH, T4TOTAL, FREET4, T3FREE, THYROIDAB in the last 72 hours. Anemia Panel: No results for input(s): VITAMINB12, FOLATE, FERRITIN, TIBC, IRON, RETICCTPCT in the last 72 hours. Sepsis Labs: No results for input(s): PROCALCITON, LATICACIDVEN in the last 168 hours.  No results found for this or any previous visit (from the past 240 hour(s)).       Radiology Studies: Dg Chest 2 View  Result Date: 05/08/2018 CLINICAL DATA:  Chest pressure and epigastric abdominal pain today. Ex-smoker. EXAM: CHEST - 2 VIEW COMPARISON:  11/21/2011. FINDINGS: Normal sized heart. Tortuous aorta. Stable mild diffuse peribronchial thickening. Clear lungs. 1.4 cm nodular density overlying the posterior upper lung zones on the lateral view, not seen on the frontal view. Diffuse osteopenia, mild to moderate levoconvex scoliosis and thoracolumbar spine degenerative changes. Bilateral shoulder degenerative changes. IMPRESSION: 1. 1. 1.4 cm nodular density overlying the posterior upper lung zones on the lateral view. This could represent a lung nodule or a vessel viewed on end. Further evaluation with a chest CT with contrast is recommended. If there is a clinical concern for pulmonary embolism, this could be performed as a chest CTA. 2. Stable mild chronic bronchitic changes compatible with the history of smoking. Electronically Signed   By: Beckie Salts M.D.   On: 05/08/2018 18:00   Ct Angio Abd/pel W/ And/or W/o  Result Date: 05/08/2018 CLINICAL DATA:  Abdominal pulsatile mass.  Aneurysm suspected.  Patient c/o epigastric/upper abd pain that radiates into lower abd. Patient reports nausea and vomiting. Denies any diarrhea, fevers, or urinary symptoms. Patient states last BM was this morning-denies any blood in stool. Patient also reports generalized fatigue and dizziness with the abd pain. EXAM: CTA ABDOMEN AND PELVIS wITHOUT AND WITH CONTRAST TECHNIQUE: Multidetector CT imaging of the abdomen and pelvis was performed using the standard protocol during bolus administration of intravenous contrast. Multiplanar reconstructed images and MIPs were obtained and reviewed to evaluate the vascular anatomy. CONTRAST:  ISOVUE-370 IOPAMIDOL (ISOVUE-370) INJECTION 76% COMPARISON:  11/11/2017 FINDINGS: VASCULAR Aorta: Abdominal aorta is normal in caliber. No aneurysm. Mild atherosclerosis. No significant stenosis. No dissection. Celiac: Mild partly calcified plaque adjacent to the origin. No significant stenosis. SMA: Patent without evidence of aneurysm, dissection, vasculitis or significant stenosis. Renals: Mild plaque at both renal artery origins. No significant stenosis. There are small secondary renal arteries bilaterally. IMA: Patent with mild plaque at its origin. Inflow: Patent without  evidence of aneurysm, dissection, vasculitis or significant stenosis. Proximal Outflow: Bilateral common femoral and visualized portions of the superficial and profunda femoral arteries are patent without evidence of aneurysm, dissection, vasculitis or significant stenosis. Veins: No obvious venous abnormality within the limitations of this arterial phase study. Review of the MIP images confirms the above findings. NON-VASCULAR Lower chest: Emphysema.  No acute findings.  Heart normal size. Hepatobiliary: Decreased attenuation of the liver consistent with diffuse fatty infiltration. No liver mass or focal lesion. Gallbladder is unremarkable. No bile duct dilation. Pancreas: Unremarkable. No pancreatic ductal dilatation or  surrounding inflammatory changes. Spleen: Normal in size without focal abnormality. Adrenals/Urinary Tract: No adrenal masses. Three point 3 cm right upper pole renal cyst. Area of apparent scarring from the upper pole of the left kidney. Small probable cyst from the lower pole the left kidney measuring 1 cm. No other renal masses or lesions. No stones. No hydronephrosis. Ureters are normal course and in caliber. Bladder is unremarkable. Stomach/Bowel: Stomach is unremarkable. Small bowel and colon are normal in caliber with no wall thickening or inflammation. Normal appendix visualized. A loop of ileum extends into a right inguinal hernia without incarceration or strangulation. Lymphatic: No adenopathy. Reproductive: Uterus and adnexa are unremarkable. Other: No ascites.  No other hernias. Musculoskeletal: No acute fracture. No osteoblastic or osteolytic lesions. There are significant degenerative changes of the visualized spine. IMPRESSION: VASCULAR 1. Normal caliber abdominal aorta.  No aneurysm or dissection. 2. Mild aortic and branch vessel atherosclerosis no significant stenosis. NON-VASCULAR 1. No acute findings within the abdomen or pelvis. No findings to account for the patient's symptoms. 2. Extensive hepatic steatosis. Electronically Signed   By: Amie Portlandavid  Ormond M.D.   On: 05/08/2018 19:10   [place] Abdomen Limited Ruq  Result Date: 05/09/2018 CLINICAL DATA:  Elevated liver function tests. EXAM: ULTRASOUND ABDOMEN LIMITED RIGHT UPPER QUADRANT COMPARISON:  CT, 05/08/2018 FINDINGS: Gallbladder: No gallbladder wall thickening. Wall measures under 3 mm. No pericholecystic fluid. No gallstones. Common bile duct: Diameter: 5 mm Liver: Increased echogenicity with decreased through transmission of the sound beam consistent with fatty infiltration. Focal fatty sparing adjacent to the gallbladder. No mass or focal lesion. Portal vein is patent on color Doppler imaging with normal direction of blood flow towards the  liver. IMPRESSION: 1. No evidence of acute cholecystitis. 2. Hepatic steatosis. Electronically Signed   By: Amie Portlandavid  Ormond M.D.   On: 05/09/2018 13:13        Scheduled Meds: . phytonadione  10 mg Subcutaneous Once  . potassium chloride  40 mEq Oral Q3H   Continuous Infusions: . sodium chloride 100 mL/hr at 05/09/18 1152     LOS: 0 days    Time spent: 25mins    Erick BlinksJehanzeb Chayse Gracey, MD [facility] Pager [phone]  If 7PM-7AM, please contact night-coverage [email or web address] Password [email or web address] 05/09/2018, 6:52 PM

## 2018-05-10 ENCOUNTER — Other Ambulatory Visit: Payer: Self-pay

## 2018-05-10 ENCOUNTER — Telehealth: Payer: Self-pay | Admitting: Gastroenterology

## 2018-05-10 DIAGNOSIS — K7010 Alcoholic hepatitis without ascites: Secondary | ICD-10-CM

## 2018-05-10 DIAGNOSIS — R1010 Upper abdominal pain, unspecified: Secondary | ICD-10-CM | POA: Diagnosis not present

## 2018-05-10 DIAGNOSIS — F1010 Alcohol abuse, uncomplicated: Secondary | ICD-10-CM | POA: Diagnosis not present

## 2018-05-10 DIAGNOSIS — R945 Abnormal results of liver function studies: Secondary | ICD-10-CM

## 2018-05-10 LAB — COMPREHENSIVE METABOLIC PANEL
ALT: 253 U/L — ABNORMAL HIGH (ref 0–44)
ANION GAP: 9 (ref 5–15)
AST: 550 U/L — AB (ref 15–41)
Albumin: 1.9 g/dL — ABNORMAL LOW (ref 3.5–5.0)
Alkaline Phosphatase: 147 U/L — ABNORMAL HIGH (ref 38–126)
BILIRUBIN TOTAL: 1.6 mg/dL — AB (ref 0.3–1.2)
CO2: 19 mmol/L — ABNORMAL LOW (ref 22–32)
Calcium: 7.2 mg/dL — ABNORMAL LOW (ref 8.9–10.3)
Chloride: 113 mmol/L — ABNORMAL HIGH (ref 98–111)
Creatinine, Ser: 0.44 mg/dL (ref 0.44–1.00)
GFR calc Af Amer: >60 mL/min (ref >60)
Glucose, Bld: 82 mg/dL (ref 70–99)
Potassium: 3.7 mmol/L (ref 3.5–5.1)
Sodium: 141 mmol/L (ref 135–145)
TOTAL PROTEIN: 4.8 g/dL — AB (ref 6.5–8.1)

## 2018-05-10 LAB — IRON AND TIBC
Iron: 68 ug/dL (ref 28–170)
Saturation Ratios: 61 % — ABNORMAL HIGH (ref 10.4–31.8)
TIBC: 112 ug/dL — ABNORMAL LOW (ref 250–450)
UIBC: 44 ug/dL

## 2018-05-10 LAB — FOLATE: FOLATE: 9.4 ng/mL (ref >5.9)

## 2018-05-10 LAB — OCCULT BLOOD X 1 CARD TO LAB, STOOL: FECAL OCCULT BLD: NEGATIVE

## 2018-05-10 LAB — FERRITIN: FERRITIN: 3393 ng/mL — AB (ref 11–307)

## 2018-05-10 LAB — VITAMIN B12: VITAMIN B 12: 1892 pg/mL — AB (ref 180–914)

## 2018-05-10 LAB — PROTIME-INR
INR: 1.33
PROTHROMBIN TIME: 16.4 s — AB (ref 11.4–15.2)

## 2018-05-10 LAB — MAGNESIUM: Magnesium: 1.6 mg/dL — ABNORMAL LOW (ref 1.7–2.4)

## 2018-05-10 MED ORDER — PANTOPRAZOLE SODIUM 40 MG PO TBEC
40.0000 mg | DELAYED_RELEASE_TABLET | Freq: Every day | ORAL | 3 refills | Status: DC
Start: 2018-05-11 — End: 2018-08-30

## 2018-05-10 MED ORDER — MAGNESIUM OXIDE -MG SUPPLEMENT 200 MG PO TABS: 200.0000 mg | ORAL_TABLET | Freq: Two times a day (BID) | ORAL | 0 refills | Status: AC

## 2018-05-10 MED ORDER — ONDANSETRON HCL 4 MG PO TABS: 4.0000 mg | ORAL_TABLET | Freq: Four times a day (QID) | ORAL | 0 refills | Status: DC | PRN

## 2018-05-10 NOTE — Progress Notes (Addendum)
Subjective: Tolerating breakfast. Stomach feels "heavy" but no pain. No N/V. States she had poor appetite for several weeks prior to admission with intermittent N/V. Would only eat every few days, limited amounts. Reports taking 4 tylenol on a routine basis (500-650 mg per tylenol). Several shots of rum every other day with water. Unable to quantify. States some depression recently intermittently. Overall feels improved and wants to go home. No confusion or mental status changes. Chronic history of GERD. Intermittent indigestion. Thinks she was on a PPI at home but unsure what it was.   Objective: Vital signs in last 24 hours: Temp:  [98.1 F (36.7 C)-98.6 F (37 C)] 98.1 F (36.7 C) (08/12 0545) Pulse Rate:  [93-97] 93 (08/12 0545) Resp:  [16-17] 17 (08/12 0545) BP: (120-124)/(77-84) 124/84 (08/12 0545) SpO2:  [97 %-100 %] 100 % (08/12 0545) Weight:  [52.3 kg] 52.3 kg (08/12 0545) Last BM Date: 05/08/18 General:   Alert and oriented, pleasant Head:  Normocephalic and atraumatic. Abdomen:  Bowel sounds present, soft, non-tender, non-distended. No HSM or hernias noted.  Neurologic:  Alert and  oriented x4 Psych:  Alert and cooperative. Normal mood and affect.  Intake/Output from previous day: 08/11 0701 - 08/12 0700 In: 3041.7 [P.O.:720; I.V.:2321.7] Out: 1450 [Urine:1450] Intake/Output this shift: No intake/output data recorded.  Lab Results: Recent Labs    05/08/18 1709 05/09/18 0621  WBC 5.2 4.3  HGB 11.8* 10.6*  HCT 34.6* 31.1*  PLT 143* 141*   BMET Recent Labs    05/08/18 1709 05/09/18 0621 05/10/18 0445  NA 134* 138 141  K 2.8* 3.0* 3.7  CL 96* 104 113*  CO2 26 25 19*  GLUCOSE 103* 87 82  BUN 10 <5* <5*  CREATININE 0.76 0.57 0.44  CALCIUM 8.2* 7.4* 7.2*   LFT Recent Labs    05/08/18 1709 05/09/18 0621 05/10/18 0445  PROT 6.8 5.4* 4.8*  ALBUMIN 2.8* 2.2* 1.9*  AST 2,836* 1,206* 550*  ALT 613* 368* 253*  ALKPHOS 168* 144* 147*  BILITOT 3.3*  2.2* 1.6*   PT/INR Recent Labs    05/08/18 2111 05/10/18 0445  LABPROT 18.0* 16.4*  INR 1.50 1.33     Studies/Results: Dg Chest 2 View  Result Date: 05/08/2018 CLINICAL DATA:  Chest pressure and epigastric abdominal pain today. Ex-smoker. EXAM: CHEST - 2 VIEW COMPARISON:  11/21/2011. FINDINGS: Normal sized heart. Tortuous aorta. Stable mild diffuse peribronchial thickening. Clear lungs. 1.4 cm nodular density overlying the posterior upper lung zones on the lateral view, not seen on the frontal view. Diffuse osteopenia, mild to moderate levoconvex scoliosis and thoracolumbar spine degenerative changes. Bilateral shoulder degenerative changes. IMPRESSION: 1. 1. 1.4 cm nodular density overlying the posterior upper lung zones on the lateral view. This could represent a lung nodule or a vessel viewed on end. Further evaluation with a chest CT with contrast is recommended. If there is a clinical concern for pulmonary embolism, this could be performed as a chest CTA. 2. Stable mild chronic bronchitic changes compatible with the history of smoking. Electronically Signed   By: Claudie Revering M.D.   On: 05/08/2018 18:00   Ct Angio Abd/pel W/ And/or W/o  Result Date: 05/08/2018 CLINICAL DATA:  Abdominal pulsatile mass.  Aneurysm suspected. Patient c/o epigastric/upper abd pain that radiates into lower abd. Patient reports nausea and vomiting. Denies any diarrhea, fevers, or urinary symptoms. Patient states last BM was this morning-denies any blood in stool. Patient also reports generalized fatigue and dizziness with the abd  pain. EXAM: CTA ABDOMEN AND PELVIS wITHOUT AND WITH CONTRAST TECHNIQUE: Multidetector CT imaging of the abdomen and pelvis was performed using the standard protocol during bolus administration of intravenous contrast. Multiplanar reconstructed images and MIPs were obtained and reviewed to evaluate the vascular anatomy. CONTRAST:  133m ISOVUE-370 IOPAMIDOL (ISOVUE-370) INJECTION 76%  COMPARISON:  11/11/2017 FINDINGS: VASCULAR Aorta: Abdominal aorta is normal in caliber. No aneurysm. Mild atherosclerosis. No significant stenosis. No dissection. Celiac: Mild partly calcified plaque adjacent to the origin. No significant stenosis. SMA: Patent without evidence of aneurysm, dissection, vasculitis or significant stenosis. Renals: Mild plaque at both renal artery origins. No significant stenosis. There are small secondary renal arteries bilaterally. IMA: Patent with mild plaque at its origin. Inflow: Patent without evidence of aneurysm, dissection, vasculitis or significant stenosis. Proximal Outflow: Bilateral common femoral and visualized portions of the superficial and profunda femoral arteries are patent without evidence of aneurysm, dissection, vasculitis or significant stenosis. Veins: No obvious venous abnormality within the limitations of this arterial phase study. Review of the MIP images confirms the above findings. NON-VASCULAR Lower chest: Emphysema.  No acute findings.  Heart normal size. Hepatobiliary: Decreased attenuation of the liver consistent with diffuse fatty infiltration. No liver mass or focal lesion. Gallbladder is unremarkable. No bile duct dilation. Pancreas: Unremarkable. No pancreatic ductal dilatation or surrounding inflammatory changes. Spleen: Normal in size without focal abnormality. Adrenals/Urinary Tract: No adrenal masses. Three point 3 cm right upper pole renal cyst. Area of apparent scarring from the upper pole of the left kidney. Small probable cyst from the lower pole the left kidney measuring 1 cm. No other renal masses or lesions. No stones. No hydronephrosis. Ureters are normal course and in caliber. Bladder is unremarkable. Stomach/Bowel: Stomach is unremarkable. Small bowel and colon are normal in caliber with no wall thickening or inflammation. Normal appendix visualized. A loop of ileum extends into a right inguinal hernia without incarceration or  strangulation. Lymphatic: No adenopathy. Reproductive: Uterus and adnexa are unremarkable. Other: No ascites.  No other hernias. Musculoskeletal: No acute fracture. No osteoblastic or osteolytic lesions. There are significant degenerative changes of the visualized spine. IMPRESSION: VASCULAR 1. Normal caliber abdominal aorta.  No aneurysm or dissection. 2. Mild aortic and branch vessel atherosclerosis no significant stenosis. NON-VASCULAR 1. No acute findings within the abdomen or pelvis. No findings to account for the patient's symptoms. 2. Extensive hepatic steatosis. Electronically Signed   By: DLajean ManesM.D.   On: 05/08/2018 19:10   [place]dom[place] Limited Ruq  Result Date: 05/09/2018 CLINICAL DATA:  Elevated liver function tests. EXAM: ULTRASOUND ABDOMEN LIMITED RIGHT UPPER QUADRANT COMPARISON:  CT, 05/08/2018 FINDINGS: Gallbladder: No gallbladder wall thickening. Wall measures under 3 mm. No pericholecystic fluid. No gallstones. Common bile duct: Diameter: 5 mm Liver: Increased echogenicity with decreased through transmission of the sound beam consistent with fatty infiltration. Focal fatty sparing adjacent to the gallbladder. No mass or focal lesion. Portal vein is patent on color Doppler imaging with normal direction of blood flow towards the liver. IMPRESSION: 1. No evidence of acute cholecystitis. 2. Hepatic steatosis. Electronically Signed   By: DLajean ManesM.D.   On: 05/09/2018 13:13    Assessment: 72year old female presenting with reported weight loss, decreased appetite, new onset epigastric pain for last week, with markedly elevated transaminases (AST 2,836 and ALT 613), bilirubin 3.3 and alk phos 168. Reports intake of several shots of rum every other day and an average of 2 grams of Tylenol daily. CTA without acute findings but  did note extensive hepatic steatosis. US abdomen RUQ without cholecystitis. DF on admission with average PT control is less than 32, so no prednisolone is  indicated. Tylenol level normal. Transaminases continue to fall, bilirubin improving, and INR improving. Clinically, she has no abdominal pain, N/V, or mental status changes.   I do note that she had elevated transaminases in Dec 2018 through PCP (AST 115, ALT 45). Platelets normal at that time. Spleen is normal on imaging. Discussed with patient avoidance of alcohol, limiting tylenol intake. Will need to be followed closely as outpatient with further serologies if persistently elevated LFTs after cessation of alcohol and recovery from acute illness. Unable to exclude possible small stone passage. Awaiting acute hepatitis panel, and anemia panel remains pending.  Clinically improved, tolerating diet, no alarm symptoms. Will need close follow-up of LFTs and INR as outpatient, which we will arrange. No need for prednisolone. Hopeful discharge later today with close outpatient follow-up.    Plan: Will arrange repeat LFTs and INR later this week Follow-up in next 1-2 weeks as outpatient Follow-up on pending hepatitis panel and anemia panel ETOH cessation, avoid tylenol for now Continue PPI daily due to GERD symptoms    Annitta Needs, PhD, ANP-BC [facility]    LOS: 0 days    05/10/2018, 8:14 AM

## 2018-05-10 NOTE — Telephone Encounter (Signed)
Please have patient follow-up in 2 weeks. May use urgent. Can get with me if you can't find a spot for her.   Doris: Please have her repeat HFP and INR on this Thursday, diagnosis ETOH hepatitis.

## 2018-05-10 NOTE — Telephone Encounter (Signed)
Pt is aware and also her daughter, Clovia Cuffatricia Pritchett. [facility] They are aware of OV appt on 05/27/2018 @ 11:00 Am with Tana CoastLeslie Lewis, PA .

## 2018-05-10 NOTE — Telephone Encounter (Signed)
Pt has appt with Dorene Arerri Setzer on 05/26/2018 at 2:15 pm.  Elease Hashimotoatricia called back and said she would like to keep that appt to be with Dr. Karilyn Cotaehman. I will cancel the appt here on 05/27/2018 per Lewie LoronAnna Boone, NP. Per Tobi Bastosnna, pt still to do labs on Thursday this week.

## 2018-05-10 NOTE — Discharge Summary (Signed)
Physician Discharge Summary  Kim BarriosShirley Reese WJX:[id number] DOB: 1946-04-06 DOA: 05/08/2018  PCP: Mirna MiresHill, Gerald, MD  Admit date: 05/08/2018  Discharge date: 05/10/2018  Admitted From:Home  Disposition:  Home  Recommendations for Outpatient Follow-up:  1. Follow up with PCP in 1-2 weeks 2. Follow-up with GI in 1 to 2 weeks as scheduled  Home Health: None  Equipment/Devices: None  Discharge Condition:Stable  CODE STATUS: Full  Diet recommendation: Heart Healthy  Brief/Interim Summary:  72 year old female is brought to the hospital with a one-week history of epigastric abdominal pain.  She was noted to have significantly elevated LFTs.  She had been taking Tylenol recently and also drinks alcohol.  Imaging did not indicate any gallstones, although she may have passed one already.  LFTs are trending down and overall, patient is feeling much better this morning.  She has been seen by GI with recommendations to follow-up in the outpatient setting with acute hepatitis and anemia panel pending.  Recommendations have been given for alcohol cessation and statin has been withheld during discharge.  She has also been counseled on reducing her home Tylenol intake and to consider other pain medication alternatives after seeing her primary care provider.  She will remain on PPI on a daily basis which has been prescribed for her on discharge.  Discharge Diagnoses:  Principal Problem:   Abdominal pain Active Problems:   Essential hypertension   Elevated LFTs   Alcohol abuse   Hyperlipidemia   Hypokalemia  1. Transaminitis likely secondary to alcohol abuse.  It is possible that she may have also passed a gallstone, but enzymes are currently downtrending and clinical symptoms have improved to the point where she has minimal abdominal pain and no further nausea vomiting and is tolerating a diet.  She continues to have poor appetite however.  GI to follow-up in the outpatient clinic in 1 to 2 weeks for  anemia panel and acute hepatitis panel. 2. Hyperlipidemia.  Continue to hold statin at home and this has been discussed with the patient.  Can resume once cleared by GI. 3. Hypertension.  Hold home Norvasc as patient is normotensive. 4. Mild hypomagnesemia.  Home magnesium supplementation as ordered. 5. Alcohol abuse.  Counseled on cessation of alcohol at home given above findings.  No signs of withdrawal noted.  Discharge Instructions  Discharge Instructions    Diet - low sodium heart healthy   Complete by:  As directed    Increase activity slowly   Complete by:  As directed      Allergies as of 05/10/2018      Reactions   Penicillins Rash   Has patient had a PCN reaction causing immediate rash, facial/tongue/throat swelling, SOB or lightheadedness with hypotension: Yes Has patient had a PCN reaction causing severe rash involving mucus membranes or skin necrosis: No Has patient had a PCN reaction that required hospitalization: No Has patient had a PCN reaction occurring within the last 10 years: No If all of the above answers are "NO", then may proceed with Cephalosporin use.      Medication List    STOP taking these medications   acetaminophen 650 MG CR tablet Commonly known as:  TYLENOL   amLODipine 5 MG tablet Commonly known as:  NORVASC   pravastatin 20 MG tablet Commonly known as:  PRAVACHOL     TAKE these medications   aspirin EC 81 MG tablet Take 81 mg by mouth daily.   brinzolamide 1 % ophthalmic suspension Commonly known as:  AZOPT Place  1 drop into the right eye 3 (three) times daily.   COMBIGAN 0.2-0.5 % ophthalmic solution Generic drug:  brimonidine-timolol Place 1 drop into the right eye every 12 (twelve) hours.   cycloSPORINE 0.05 % ophthalmic emulsion Commonly known as:  RESTASIS Place 1 drop into both eyes 2 (two) times daily.   latanoprost 0.005 % ophthalmic solution Commonly known as:  XALATAN Place 1 drop into both eyes at bedtime.    Magnesium Oxide 200 MG Tabs Take 1 tablet (200 mg total) by mouth 2 (two) times daily for 15 days.   multivitamin with minerals Tabs tablet Take 1 tablet by mouth daily.   ondansetron 4 MG tablet Commonly known as:  ZOFRAN Take 1 tablet (4 mg total) by mouth every 6 (six) hours as needed for nausea.   pantoprazole 40 MG tablet Commonly known as:  PROTONIX Take 1 tablet (40 mg total) by mouth daily. Start taking on:  05/11/2018   vitamin E 400 UNIT capsule Take 400 Units by mouth daily.      Follow-up Information    Mirna Mires, MD Follow up in 2 week(s).   Specialty:  Family Medicine Contact information: [address] [place] [address] [phone]          Allergies  Allergen Reactions  . Penicillins Rash    Has patient had a PCN reaction causing immediate rash, facial/tongue/throat swelling, SOB or lightheadedness with hypotension: Yes Has patient had a PCN reaction causing severe rash involving mucus membranes or skin necrosis: No Has patient had a PCN reaction that required hospitalization: No Has patient had a PCN reaction occurring within the last 10 years: No If all of the above answers are "NO", then may proceed with Cephalosporin use.     Consultations:  GI   Procedures/Studies: Dg Chest 2 View  Result Date: 05/08/2018 CLINICAL DATA:  Chest pressure and epigastric abdominal pain today. Ex-smoker. EXAM: CHEST - 2 VIEW COMPARISON:  11/21/2011. FINDINGS: Normal sized heart. Tortuous aorta. Stable mild diffuse peribronchial thickening. Clear lungs. 1.4 cm nodular density overlying the posterior upper lung zones on the lateral view, not seen on the frontal view. Diffuse osteopenia, mild to moderate levoconvex scoliosis and thoracolumbar spine degenerative changes. Bilateral shoulder degenerative changes. IMPRESSION: 1. 1. 1.4 cm nodular density overlying the posterior upper lung zones on the lateral view. This could represent a lung nodule or a  vessel viewed on end. Further evaluation with a chest CT with contrast is recommended. If there is a clinical concern for pulmonary embolism, this could be performed as a chest CTA. 2. Stable mild chronic bronchitic changes compatible with the history of smoking. Electronically Signed   By: Beckie Salts M.D.   On: 05/08/2018 18:00   Ct Angio Abd/pel W/ And/or W/o  Result Date: 05/08/2018 CLINICAL DATA:  Abdominal pulsatile mass.  Aneurysm suspected. Patient c/o epigastric/upper abd pain that radiates into lower abd. Patient reports nausea and vomiting. Denies any diarrhea, fevers, or urinary symptoms. Patient states last BM was this morning-denies any blood in stool. Patient also reports generalized fatigue and dizziness with the abd pain. EXAM: CTA ABDOMEN AND PELVIS wITHOUT AND WITH CONTRAST TECHNIQUE: Multidetector CT imaging of the abdomen and pelvis was performed using the standard protocol during bolus administration of intravenous contrast. Multiplanar reconstructed images and MIPs were obtained and reviewed to evaluate the vascular anatomy. CONTRAST:  ISOVUE-370 IOPAMIDOL (ISOVUE-370) INJECTION 76% COMPARISON:  11/11/2017 FINDINGS: VASCULAR Aorta: Abdominal aorta is normal in caliber. No  aneurysm. Mild atherosclerosis. No significant stenosis. No dissection. Celiac: Mild partly calcified plaque adjacent to the origin. No significant stenosis. SMA: Patent without evidence of aneurysm, dissection, vasculitis or significant stenosis. Renals: Mild plaque at both renal artery origins. No significant stenosis. There are small secondary renal arteries bilaterally. IMA: Patent with mild plaque at its origin. Inflow: Patent without evidence of aneurysm, dissection, vasculitis or significant stenosis. Proximal Outflow: Bilateral common femoral and visualized portions of the superficial and profunda femoral arteries are patent without evidence of aneurysm, dissection, vasculitis or significant stenosis.  Veins: No obvious venous abnormality within the limitations of this arterial phase study. Review of the MIP images confirms the above findings. NON-VASCULAR Lower chest: Emphysema.  No acute findings.  Heart normal size. Hepatobiliary: Decreased attenuation of the liver consistent with diffuse fatty infiltration. No liver mass or focal lesion. Gallbladder is unremarkable. No bile duct dilation. Pancreas: Unremarkable. No pancreatic ductal dilatation or surrounding inflammatory changes. Spleen: Normal in size without focal abnormality. Adrenals/Urinary Tract: No adrenal masses. Three point 3 cm right upper pole renal cyst. Area of apparent scarring from the upper pole of the left kidney. Small probable cyst from the lower pole the left kidney measuring 1 cm. No other renal masses or lesions. No stones. No hydronephrosis. Ureters are normal course and in caliber. Bladder is unremarkable. Stomach/Bowel: Stomach is unremarkable. Small bowel and colon are normal in caliber with no wall thickening or inflammation. Normal appendix visualized. A loop of ileum extends into a right inguinal hernia without incarceration or strangulation. Lymphatic: No adenopathy. Reproductive: Uterus and adnexa are unremarkable. Other: No ascites.  No other hernias. Musculoskeletal: No acute fracture. No osteoblastic or osteolytic lesions. There are significant degenerative changes of the visualized spine. IMPRESSION: VASCULAR 1. Normal caliber abdominal aorta.  No aneurysm or dissection. 2. Mild aortic and branch vessel atherosclerosis no significant stenosis. NON-VASCULAR 1. No acute findings within the abdomen or pelvis. No findings to account for the patient's symptoms. 2. Extensive hepatic steatosis. Electronically Signed   By: Amie Portland M.D.   On: 05/08/2018 19:10   US Abdomen Limited Ruq  Result Date: 05/09/2018 CLINICAL DATA:  Elevated liver function tests. EXAM: ULTRASOUND ABDOMEN LIMITED RIGHT UPPER QUADRANT COMPARISON:  CT,  05/08/2018 FINDINGS: Gallbladder: No gallbladder wall thickening. Wall measures under 3 mm. No pericholecystic fluid. No gallstones. Common bile duct: Diameter: 5 mm Liver: Increased echogenicity with decreased through transmission of the sound beam consistent with fatty infiltration. Focal fatty sparing adjacent to the gallbladder. No mass or focal lesion. Portal vein is patent on color Doppler imaging with normal direction of blood flow towards the liver. IMPRESSION: 1. No evidence of acute cholecystitis. 2. Hepatic steatosis. Electronically Signed   By: Amie Portland M.D.   On: 05/09/2018 13:13    Discharge Exam: Vitals:   05/09/18 2137 05/10/18 0545  BP: 120/77 124/84  Pulse: 97 93  Resp: 16 17  Temp: 98.6 F (37 C) 98.1 F (36.7 C)  SpO2: 100% 100%   Vitals:   05/09/18 0657 05/09/18 1941 05/09/18 2137 05/10/18 0545  BP:   120/77 124/84  Pulse:   97 93  Resp:   16 17  Temp:   98.6 F (37 C) 98.1 F (36.7 C)  TempSrc:   Oral Oral  SpO2:  97% 100% 100%  Weight: 51.6 kg   52.3 kg  Height:        General: Pt is alert, awake, not in acute distress Cardiovascular: RRR, S1/S2 +, no rubs,  no gallops Respiratory: CTA bilaterally, no wheezing, no rhonchi Abdominal: Soft, NT, ND, bowel sounds + Extremities: no edema, no cyanosis    The results of significant diagnostics from this hospitalization (including imaging, microbiology, ancillary and laboratory) are listed below for reference.     Microbiology: No results found for this or any previous visit (from the past 240 hour(s)).   Labs: BNP (last 3 results) No results for input(s): BNP in the last 8760 hours. Basic Metabolic Panel: Recent Labs  Lab 05/08/18 1709 05/09/18 0621 05/10/18 0445  NA 134* 138 141  K 2.8* 3.0* 3.7  CL 96* 104 113*  CO2 26 25 19*  GLUCOSE 103* 87 82  BUN 10 <5* <5*  CREATININE 0.76 0.57 0.44  CALCIUM 8.2* 7.4* 7.2*  MG  --   --  1.6*   Liver Function Tests: Recent Labs  Lab  05/08/18 1709 05/09/18 0621 05/10/18 0445  AST 2,836* 1,206* 550*  ALT 613* 368* 253*  ALKPHOS 168* 144* 147*  BILITOT 3.3* 2.2* 1.6*  PROT 6.8 5.4* 4.8*  ALBUMIN 2.8* 2.2* 1.9*   Recent Labs  Lab 05/08/18 1709  LIPASE 56*   Recent Labs  Lab 05/08/18 2111  AMMONIA 17   CBC: Recent Labs  Lab 05/08/18 1709 05/09/18 0621  WBC 5.2 4.3  HGB 11.8* 10.6*  HCT 34.6* 31.1*  MCV 96.1 95.4  PLT 143* 141*   Cardiac Enzymes: No results for input(s): CKTOTAL, CKMB, CKMBINDEX, TROPONINI in the last 168 hours. BNP: Invalid input(s): POCBNP CBG: No results for input(s): GLUCAP in the last 168 hours. D-Dimer No results for input(s): DDIMER in the last 72 hours. Hgb A1c No results for input(s): HGBA1C in the last 72 hours. Lipid Profile No results for input(s): CHOL, HDL, LDLCALC, TRIG, CHOLHDL, LDLDIRECT in the last 72 hours. Thyroid function studies No results for input(s): TSH, T4TOTAL, T3FREE, THYROIDAB in the last 72 hours.  Invalid input(s): FREET3 Anemia work up No results for input(s): VITAMINB12, FOLATE, FERRITIN, TIBC, IRON, RETICCTPCT in the last 72 hours. Urinalysis    Component Value Date/Time   COLORURINE YELLOW 05/08/2018 1915   APPEARANCEUR HAZY (A) 05/08/2018 1915   LABSPEC 1.028 05/08/2018 1915   PHURINE 7.0 05/08/2018 1915   GLUCOSEU NEGATIVE 05/08/2018 1915   HGBUR SMALL (A) 05/08/2018 1915   BILIRUBINUR NEGATIVE 05/08/2018 1915   KETONESUR 5 (A) 05/08/2018 1915   PROTEINUR NEGATIVE 05/08/2018 1915   NITRITE NEGATIVE 05/08/2018 1915   LEUKOCYTESUR LARGE (A) 05/08/2018 1915   Sepsis Labs Invalid input(s): PROCALCITONIN,  WBC,  LACTICIDVEN Microbiology No results found for this or any previous visit (from the past 240 hour(s)).   Time coordinating discharge: 40 minutes  SIGNED:   Erick BlinksPratik D Rainbow Salman, DO [facility] 05/10/2018, 11:08 AM Pager [phone]  If 7PM-7AM, please contact night-coverage [email or web address] Password [email or web address]

## 2018-05-10 NOTE — Progress Notes (Signed)
Patient discharged home with personal belongings. IV removed and site intact. Pt discharged with AVS and verbalizes understanding of instructions and prescriptions.

## 2018-05-10 NOTE — Care Management Obs Status (Signed)
MEDICARE OBSERVATION STATUS NOTIFICATION   Patient Details  Name: Kim BarriosShirley Dooling MRN: [id number] Date of Birth: 07/14/1946   Medicare Observation Status Notification Given:  Yes    Malcolm MetroChildress, Geraldin Habermehl Demske, RN 05/10/2018, 8:53 AM

## 2018-05-11 LAB — HEPATITIS PANEL, ACUTE
HCV AB: 0.1 {s_co_ratio} (ref 0.0–0.9)
HEP A IGM: NEGATIVE
HEP B C IGM: NEGATIVE
Hepatitis B Surface Ag: NEGATIVE

## 2018-05-11 NOTE — Telephone Encounter (Signed)
[facility] with [facility] note that pt will come on Thursday, 05/13/2018 for the blood work.

## 2018-05-13 ENCOUNTER — Other Ambulatory Visit (HOSPITAL_COMMUNITY)
Admission: RE | Admit: 2018-05-13 | Discharge: 2018-05-13 | Disposition: A | Discharge status: Home / Self Care | Payer: PPO | Source: Ambulatory Visit | Attending: Gastroenterology | Admitting: Gastroenterology

## 2018-05-13 DIAGNOSIS — K7010 Alcoholic hepatitis without ascites: Secondary | ICD-10-CM | POA: Insufficient documentation

## 2018-05-13 LAB — HEPATIC FUNCTION PANEL
ALBUMIN: 2.5 g/dL — AB (ref 3.5–5.0)
ALK PHOS: 118 U/L (ref 38–126)
ALT: 137 U/L — AB (ref 0–44)
AST: 133 U/L — ABNORMAL HIGH (ref 15–41)
Bilirubin, Direct: 0.6 mg/dL — ABNORMAL HIGH (ref 0.0–0.2)
Indirect Bilirubin: 0.9 mg/dL (ref 0.3–0.9)
Total Bilirubin: 1.5 mg/dL — ABNORMAL HIGH (ref 0.3–1.2)
Total Protein: 6.2 g/dL — ABNORMAL LOW (ref 6.5–8.1)

## 2018-05-13 LAB — PROTIME-INR
INR: 1.09
Prothrombin Time: 14 seconds (ref 11.4–15.2)

## 2018-05-13 NOTE — Progress Notes (Signed)
Pt is aware.  

## 2018-05-13 NOTE — Progress Notes (Signed)
LFTs and INR are improving. Glad to see this!

## 2018-05-26 ENCOUNTER — Encounter (INDEPENDENT_AMBULATORY_CARE_PROVIDER_SITE_OTHER): Payer: Self-pay | Admitting: Internal Medicine

## 2018-05-26 ENCOUNTER — Ambulatory Visit (INDEPENDENT_AMBULATORY_CARE_PROVIDER_SITE_OTHER): Payer: PPO | Admitting: Internal Medicine

## 2018-05-26 VITALS — BP 108/70 | HR 66 | Resp 18 | Ht 62.0 in | Wt 117.5 lb

## 2018-05-26 DIAGNOSIS — K7010 Alcoholic hepatitis without ascites: Secondary | ICD-10-CM | POA: Diagnosis not present

## 2018-05-26 DIAGNOSIS — R945 Abnormal results of liver function studies: Secondary | ICD-10-CM | POA: Diagnosis not present

## 2018-05-26 NOTE — Patient Instructions (Addendum)
Labs today. OV in 3 months.  

## 2018-05-26 NOTE — Progress Notes (Signed)
Subjective:    Patient ID: Kim Reese, female    DOB: Jul 29, 1946, 72 y.o.   MRN: [id number]  HPI Here today for f/u. Recent admission to [facility] for alcoholic hepatitis. Had been gradually loosing weight had had falling episodes.  She had pain across her upper abdomen.  No pain while in the ED. Noted to have elevated AST and ALT. Had been drinking too much alcohol for a few months per records.  She had not had any etoh for 3 weeks prior to her admission to [facility] in August.  She tells me she is doing okay. She is eating. Daughter states her appetite has improved. She is having a BM daily. No melena or BRRB. Has not taking any Miralax lately.  She does not smoke.   Denies periods of confusion.  Acute Hepatitis Panel negative.  05/09/2018 [place] RUQ:  Elevated liver enzymes.  IMPRESSION: 1. No evidence of acute cholecystitis. 2. Hepatic steatosis.  Hepatic Function Latest Ref Rng & Units 05/13/2018 05/10/2018 05/09/2018  Total Protein 6.5 - 8.1 g/dL 6.2(L) 4.8(L) 5.4(L)  Albumin 3.5 - 5.0 g/dL 2.5(L) 1.9(L) 2.2(L)  AST 15 - 41 U/L 133(H) 550(H) 1,206(H)  ALT 0 - 44 U/L 137(H) 253(H) 368(H)  Alk Phosphatase 38 - 126 U/L 118 147(H) 144(H)  Total Bilirubin 0.3 - 1.2 mg/dL 1.5(H) 1.6(H) 2.2(H)  Bilirubin, Direct 0.0 - 0.2 mg/dL 0.6(H) - -   CBC    Component Value Date/Time   WBC 4.3 05/09/2018 0621   RBC 3.26 (L) 05/09/2018 0621   HGB 10.6 (L) 05/09/2018 0621   HCT 31.1 (L) 05/09/2018 0621   PLT 141 (L) 05/09/2018 0621   MCV 95.4 05/09/2018 0621   MCH 32.5 05/09/2018 0621   MCHC 34.1 05/09/2018 0621   RDW 14.8 05/09/2018 0621     Review of Systems Past Medical History:  Diagnosis Date  . Cataract   . Glaucoma   . Hyperlipidemia   . Hypertension   . Osteoarthritis     Past Surgical History:  Procedure Laterality Date  . CATARACT EXTRACTION W/PHACO  11/26/2011   Procedure: CATARACT EXTRACTION PHACO AND INTRAOCULAR LENS PLACEMENT (IOC);  Surgeon: Marylynn Pearson, MD;  Location: [facility];   Service: Ophthalmology;  Laterality: Left;    Allergies  Allergen Reactions  . Penicillins Rash    Has patient had a PCN reaction causing immediate rash, facial/tongue/throat swelling, SOB or lightheadedness with hypotension: Yes Has patient had a PCN reaction causing severe rash involving mucus membranes or skin necrosis: No Has patient had a PCN reaction that required hospitalization: No Has patient had a PCN reaction occurring within the last 10 years: No If all of the above answers are "NO", then may proceed with Cephalosporin use.     Current Outpatient Medications on File Prior to Visit  Medication Sig Dispense Refill  . aspirin EC 81 MG tablet Take 81 mg by mouth daily.    . brimonidine-timolol (COMBIGAN) 0.2-0.5 % ophthalmic solution Place 1 drop into the right eye every 12 (twelve) hours.    . brinzolamide (AZOPT) 1 % ophthalmic suspension Place 1 drop into the right eye 3 (three) times daily.    . cycloSPORINE (RESTASIS) 0.05 % ophthalmic emulsion Place 1 drop into both eyes 2 (two) times daily.    Marland Kitchen ibuprofen (ADVIL,MOTRIN) 200 MG tablet Take 200 mg by mouth every 6 (six) hours as needed. Patient states that she takes 2 by mouth twice a day.    . latanoprost (XALATAN) 0.005 %  ophthalmic solution Place 1 drop into both eyes at bedtime.     . Multiple Vitamin (MULITIVITAMIN WITH MINERALS) TABS Take 1 tablet by mouth daily.    . Omega-3 Fatty Acids (FISH OIL PO) Take by mouth daily.    . ondansetron (ZOFRAN) 4 MG tablet Take 1 tablet (4 mg total) by mouth every 6 (six) hours as needed for nausea. 20 tablet 0  . pantoprazole (PROTONIX) 40 MG tablet Take 1 tablet (40 mg total) by mouth daily. 30 tablet 3  . vitamin E 400 UNIT capsule Take 400 Units by mouth daily.     No current facility-administered medications on file prior to visit.         Objective:   Physical Exam Blood pressure 108/70, pulse 66, resp. rate 18, height '5\' 2"'$  (1.575 m), weight 117 lb 8 oz (53.3 kg). Alert  and oriented. Skin warm and dry. Oral mucosa is moist.   . Sclera anicteric, conjunctivae is pink. Thyroid not enlarged. No cervical lymphadenopathy. Lungs clear. Heart regular rate and rhythm.  Abdomen is soft. Bowel sounds are positive. No hepatomegaly. No abdominal masses felt. No tenderness.  No edema to lower extremities.            Assessment & Plan:  Alcoholic hepatitis. She is not drinking . Am going to get an Hepatic, CBC and INR. OV in 3 months.

## 2018-05-27 ENCOUNTER — Ambulatory Visit: Payer: PPO | Admitting: Gastroenterology

## 2018-05-27 ENCOUNTER — Other Ambulatory Visit (HOSPITAL_COMMUNITY): Payer: Self-pay | Admitting: Family Medicine

## 2018-05-27 DIAGNOSIS — R918 Other nonspecific abnormal finding of lung field: Secondary | ICD-10-CM

## 2018-05-27 LAB — CBC
HCT: 32.6 % — ABNORMAL LOW (ref 35.0–45.0)
Hemoglobin: 10.8 g/dL — ABNORMAL LOW (ref 11.7–15.5)
MCH: 32 pg (ref 27.0–33.0)
MCHC: 33.1 g/dL (ref 32.0–36.0)
MCV: 96.4 fL (ref 80.0–100.0)
MPV: 11 fL (ref 7.5–12.5)
PLATELETS: 284 10*3/uL (ref 140–400)
RBC: 3.38 10*6/uL — ABNORMAL LOW (ref 3.80–5.10)
RDW: 12.8 % (ref 11.0–15.0)
WBC: 6.5 10*3/uL (ref 3.8–10.8)

## 2018-05-27 LAB — HEPATIC FUNCTION PANEL
AG Ratio: 0.8 (calc) — ABNORMAL LOW (ref 1.0–2.5)
ALKALINE PHOSPHATASE (APISO): 71 U/L (ref 33–130)
ALT: 17 U/L (ref 6–29)
AST: 27 U/L (ref 10–35)
Albumin: 2.8 g/dL — ABNORMAL LOW (ref 3.6–5.1)
BILIRUBIN DIRECT: 0.2 mg/dL (ref 0.0–0.2)
BILIRUBIN INDIRECT: 0.4 mg/dL (ref 0.2–1.2)
GLOBULIN: 3.6 g/dL (ref 1.9–3.7)
Total Bilirubin: 0.6 mg/dL (ref 0.2–1.2)
Total Protein: 6.4 g/dL (ref 6.1–8.1)

## 2018-05-27 LAB — PROTIME-INR
INR: 1
PROTHROMBIN TIME: 10.5 s (ref 9.0–11.5)

## 2018-06-11 ENCOUNTER — Ambulatory Visit (HOSPITAL_COMMUNITY): Payer: PPO

## 2018-06-15 ENCOUNTER — Ambulatory Visit (HOSPITAL_COMMUNITY)
Admission: RE | Admit: 2018-06-15 | Discharge: 2018-06-15 | Disposition: A | Discharge status: Home / Self Care | Payer: PPO | Source: Ambulatory Visit | Attending: Family Medicine | Admitting: Family Medicine

## 2018-06-15 ENCOUNTER — Other Ambulatory Visit (HOSPITAL_COMMUNITY): Payer: Self-pay | Admitting: Family Medicine

## 2018-06-15 DIAGNOSIS — I2510 Atherosclerotic heart disease of native coronary artery without angina pectoris: Secondary | ICD-10-CM | POA: Insufficient documentation

## 2018-06-15 DIAGNOSIS — J439 Emphysema, unspecified: Secondary | ICD-10-CM | POA: Diagnosis not present

## 2018-06-15 DIAGNOSIS — J432 Centrilobular emphysema: Secondary | ICD-10-CM | POA: Diagnosis not present

## 2018-06-15 DIAGNOSIS — I700 Atherosclerosis of aorta: Secondary | ICD-10-CM | POA: Insufficient documentation

## 2018-06-15 DIAGNOSIS — R918 Other nonspecific abnormal finding of lung field: Secondary | ICD-10-CM

## 2018-06-15 MED ORDER — IOHEXOL 300 MG/ML  SOLN
75.0000 mL | Freq: Once | INTRAMUSCULAR | Status: AC | PRN
  Administered 2018-06-15: 75 mL via INTRAVENOUS

## 2018-06-21 DIAGNOSIS — I10 Essential (primary) hypertension: Secondary | ICD-10-CM | POA: Diagnosis not present

## 2018-06-21 DIAGNOSIS — R609 Edema, unspecified: Secondary | ICD-10-CM | POA: Diagnosis not present

## 2018-06-21 DIAGNOSIS — Z961 Presence of intraocular lens: Secondary | ICD-10-CM | POA: Diagnosis not present

## 2018-07-02 DIAGNOSIS — H401133 Primary open-angle glaucoma, bilateral, severe stage: Secondary | ICD-10-CM | POA: Diagnosis not present

## 2018-07-02 DIAGNOSIS — Z961 Presence of intraocular lens: Secondary | ICD-10-CM | POA: Diagnosis not present

## 2018-07-02 DIAGNOSIS — H25811 Combined forms of age-related cataract, right eye: Secondary | ICD-10-CM | POA: Diagnosis not present

## 2018-07-12 DIAGNOSIS — Z6823 Body mass index (BMI) 23.0-23.9, adult: Secondary | ICD-10-CM | POA: Diagnosis not present

## 2018-07-12 DIAGNOSIS — R600 Localized edema: Secondary | ICD-10-CM | POA: Diagnosis not present

## 2018-07-12 DIAGNOSIS — F10920 Alcohol use, unspecified with intoxication, uncomplicated: Secondary | ICD-10-CM | POA: Diagnosis not present

## 2018-07-12 DIAGNOSIS — Z0000 Encounter for general adult medical examination without abnormal findings: Secondary | ICD-10-CM | POA: Diagnosis not present

## 2018-07-12 DIAGNOSIS — I10 Essential (primary) hypertension: Secondary | ICD-10-CM | POA: Diagnosis not present

## 2018-08-16 DIAGNOSIS — H401133 Primary open-angle glaucoma, bilateral, severe stage: Secondary | ICD-10-CM | POA: Diagnosis not present

## 2018-08-16 DIAGNOSIS — H25811 Combined forms of age-related cataract, right eye: Secondary | ICD-10-CM | POA: Diagnosis not present

## 2018-08-16 DIAGNOSIS — Z961 Presence of intraocular lens: Secondary | ICD-10-CM | POA: Diagnosis not present

## 2018-08-30 ENCOUNTER — Ambulatory Visit (INDEPENDENT_AMBULATORY_CARE_PROVIDER_SITE_OTHER): Payer: PPO | Admitting: Internal Medicine

## 2018-08-30 ENCOUNTER — Encounter (INDEPENDENT_AMBULATORY_CARE_PROVIDER_SITE_OTHER): Payer: Self-pay | Admitting: Internal Medicine

## 2018-08-30 VITALS — BP 150/90 | HR 60 | Temp 97.6°F | Ht 62.0 in | Wt 123.2 lb

## 2018-08-30 DIAGNOSIS — K7010 Alcoholic hepatitis without ascites: Secondary | ICD-10-CM

## 2018-08-30 NOTE — Progress Notes (Signed)
   Subjective:    Patient ID: Kim BarriosShirley Reese, female    DOB: Aug 05, 1946, 72 y.o.   MRN: [id number]  HPI Here today for f/u. Last seen in August of this year. Hx of alcoholic hepatitis. At OV her liver enzymes were normal.  Admitted to [facility] in August for alcoholic hepatitis. Liver enzymes were elevated.  Had not drank anything 3 weeks prior to her admission.  She has started to drink again. She says she may drink once a week. She will drink 1 shot of liquor.  Her appetite is good. No weight loss.  She lives by her self.  Her BMs move okay. She denies any periods of confusion. She denies etoh intoxication.    Acute Hepatitis Panel was negative.  05/09/2018 [place] RUQ: elevated liver enzymes 1. No evidence of acute cholecystitis. 2. Hepatic steatosis.     CMP Latest Ref Rng & Units 05/26/2018 05/13/2018 05/10/2018  Glucose 70 - 99 mg/dL - - 82  BUN 8 - 23 mg/dL - - <2(Z<5(L)  Creatinine 3.660.44 - 1.00 mg/dL - - 4.400.44  Sodium 347135 - 145 mmol/L - - 141  Potassium 3.5 - 5.1 mmol/L - - 3.7  Chloride 98 - 111 mmol/L - - 113(H)  CO2 22 - 32 mmol/L - - 19(L)  Calcium 8.9 - 10.3 mg/dL - - 7.2(L)  Total Protein 6.1 - 8.1 g/dL 6.4 4.2(V6.2(L) 4.8(L)  Total Bilirubin 0.2 - 1.2 mg/dL 0.6 9.5(G1.5(H) 3.8(V1.6(H)  Alkaline Phos 38 - 126 U/L - 118 147(H)  AST 10 - 35 U/L 27 133(H) 550(H)  ALT 6 - 29 U/L 17 137(H) 253(H)     CBC    Component Value Date/Time   WBC 6.5 05/26/2018 1500   RBC 3.38 (L) 05/26/2018 1500   HGB 10.8 (L) 05/26/2018 1500   HCT 32.6 (L) 05/26/2018 1500   PLT 284 05/26/2018 1500   MCV 96.4 05/26/2018 1500   MCH 32.0 05/26/2018 1500   MCHC 33.1 05/26/2018 1500   RDW 12.8 05/26/2018 1500       Review of Systems     Objective:   Physical Exam Blood pressure (!) 150/90, pulse 60, temperature 97.6 F (36.4 C), height 5\' 2"  (1.575 m), weight 123 lb 3.2 oz (55.9 kg).   Alert and oriented. Skin warm and dry. Oral mucosa is moist.   . Sclera anicteric, conjunctivae is pink. Thyroid not  enlarged. No cervical lymphadenopathy. Lungs clear. Heart regular rate and rhythm.  Abdomen is soft. Bowel sounds are positive. No hepatomegaly. No abdominal masses felt. No tenderness.  No edema to lower extremities.         Assessment & Plan:  Alcoholic hepatitis. Admits to drinking on occasion.  She is doing better. Will get an Hepatic and and CBC. OV in 6 months.

## 2018-08-30 NOTE — Patient Instructions (Signed)
OV in 6 months. 

## 2018-08-31 LAB — CBC
HEMATOCRIT: 39.3 % (ref 35.0–45.0)
Hemoglobin: 13.2 g/dL (ref 11.7–15.5)
MCH: 30.1 pg (ref 27.0–33.0)
MCHC: 33.6 g/dL (ref 32.0–36.0)
MCV: 89.5 fL (ref 80.0–100.0)
MPV: 11.3 fL (ref 7.5–12.5)
PLATELETS: 195 10*3/uL (ref 140–400)
RBC: 4.39 10*6/uL (ref 3.80–5.10)
RDW: 12.6 % (ref 11.0–15.0)
WBC: 6.8 10*3/uL (ref 3.8–10.8)

## 2018-08-31 LAB — HEPATIC FUNCTION PANEL
AG Ratio: 1.1 (calc) (ref 1.0–2.5)
ALKALINE PHOSPHATASE (APISO): 49 U/L (ref 33–130)
ALT: 9 U/L (ref 6–29)
AST: 24 U/L (ref 10–35)
Albumin: 3.9 g/dL (ref 3.6–5.1)
BILIRUBIN DIRECT: 0.1 mg/dL (ref 0.0–0.2)
BILIRUBIN INDIRECT: 0.4 mg/dL (ref 0.2–1.2)
Globulin: 3.7 g/dL (calc) (ref 1.9–3.7)
Total Bilirubin: 0.5 mg/dL (ref 0.2–1.2)
Total Protein: 7.6 g/dL (ref 6.1–8.1)

## 2018-09-07 DIAGNOSIS — I10 Essential (primary) hypertension: Secondary | ICD-10-CM | POA: Diagnosis not present

## 2018-09-07 DIAGNOSIS — Z6823 Body mass index (BMI) 23.0-23.9, adult: Secondary | ICD-10-CM | POA: Diagnosis not present

## 2018-09-08 ENCOUNTER — Other Ambulatory Visit: Payer: Self-pay

## 2018-09-08 NOTE — Patient Outreach (Signed)
[facility] Care Management  09/08/2018  Johnnette BarriosShirley Huizar [birth year] [id number]   TELEPHONE SCREENING Referral date: 09/07/18 Referral source: Insurance/[employer]  Referral reason: Fall risk, hx of alcohol abuse Insurance: [employer]  Telephone call to patient regarding referral from [employer] wellness visit concerning fall risk and hx of alcohol abuse.    Member states she has only had one fall in the last 3 months in which she tripped over the vacuum cleaner.  Denies any injury.  States that her son put in grab bars in her shower and put in a raised toilet seat in her bathroom after her wellness visit.  States she is only having 1 to 2 shots 2 times a week.  States she does not feel she needs any help or counseling at this time.  Instructed on [facility] Care Management services.  Declines referral for social work for counseling for alcohol abuse, declines need for home safety evaluation and declines health coach for HTN.  PLAN: Plan to close case.  Member is eligible for programs but declines services. Plan to send successful outreach letter. Dudley MajorMelissa Hyun Reali RN, Maximiano CossBSN,CCM, CDE Care Management Coordinator [facility] Care Management [phone]

## 2018-11-14 IMAGING — CT CT CHEST W/ CM
2 of 4 series · 15 of 36 positions shown, 18 images · IV contrast (omnipaque)
Comparison: 05/08/2018 chest radiograph.

CLINICAL DATA: Uncertain nodular density overlying the lungs on
recent chest radiograph

EXAM:
CT CHEST WITH CONTRAST
TECHNIQUE: Multidetector CT imaging of the chest was performed during
intravenous contrast administration.
CONTRAST:  75mL OMNIPAQUE IOHEXOL 300 MG/ML  SOLN

[Series 2: axial st · axial · 0.64mm/px · z∈[-323,-83]mm · 12 of 142 slices shown, 15 images]
[im 11/142  mediastinal]
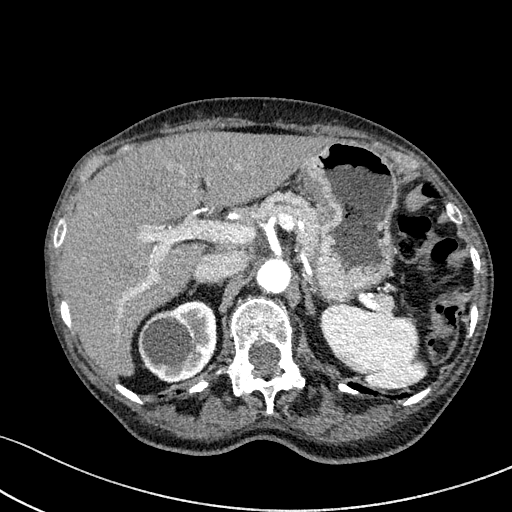
[im 11/142  lung]
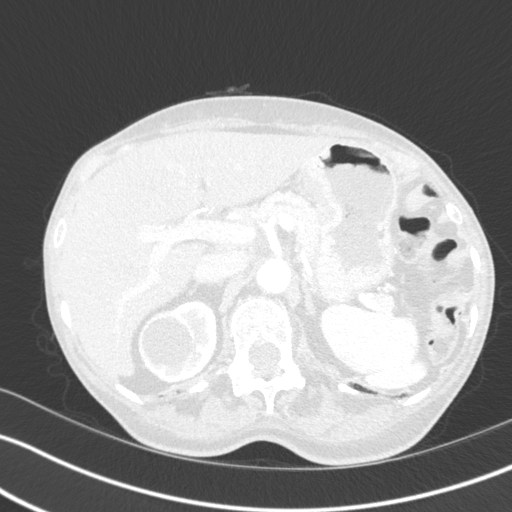
[im 22/142  lung]
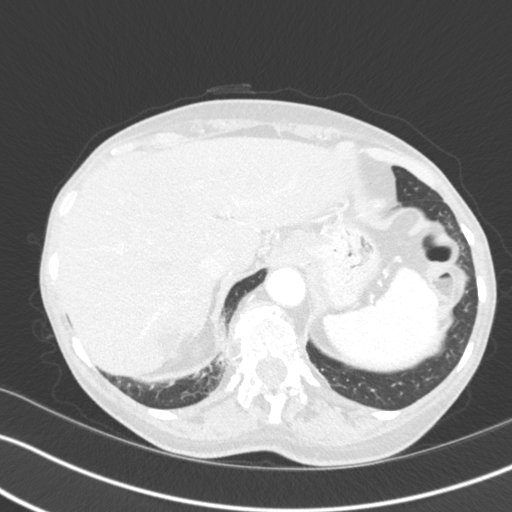
[im 33/142  lung]
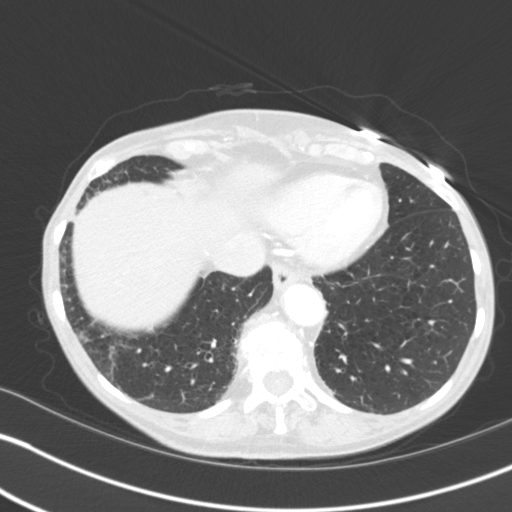
[im 44/142  lung]
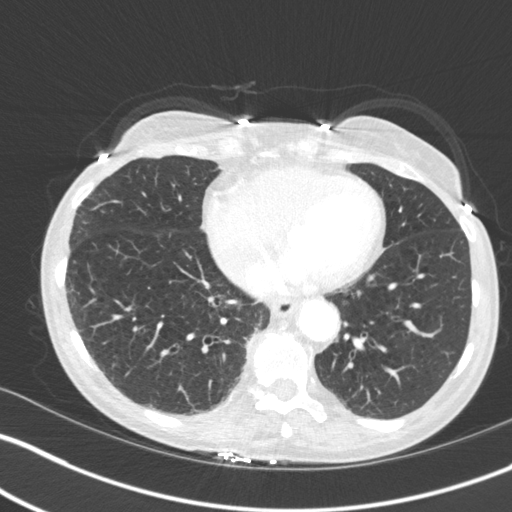
[im 55/142  mediastinal]
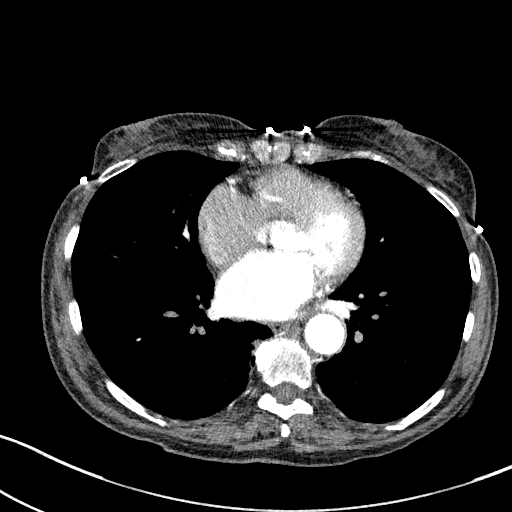
[im 55/142  lung]
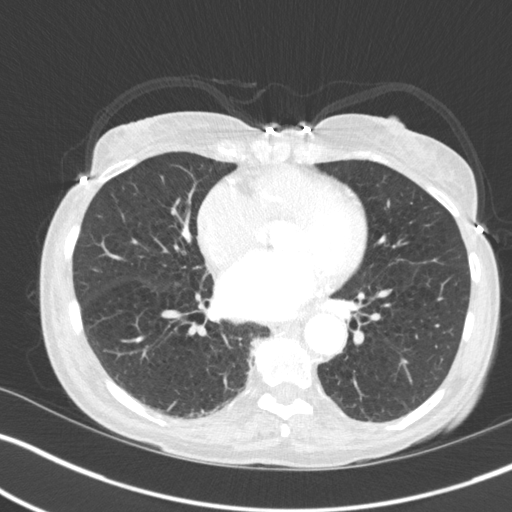
[im 66/142  lung]
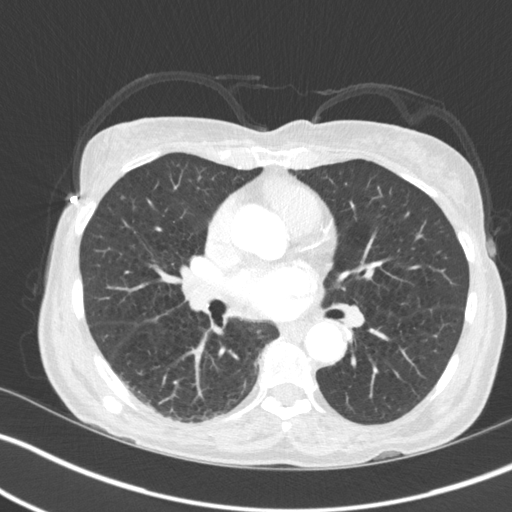
[im 76/142  lung]
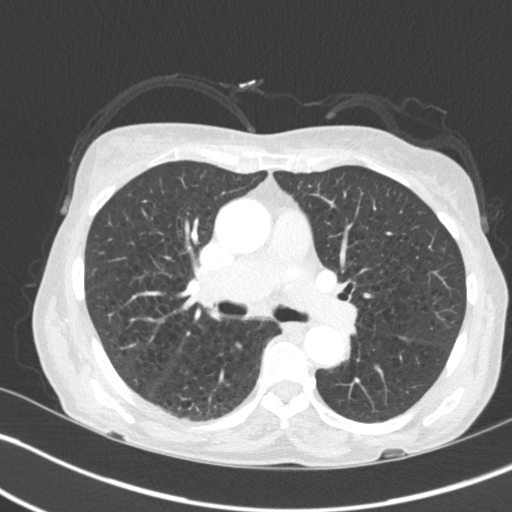
[im 87/142  lung]
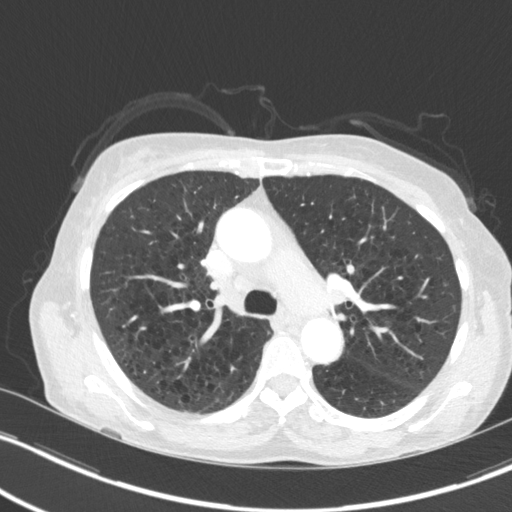
[im 98/142  mediastinal]
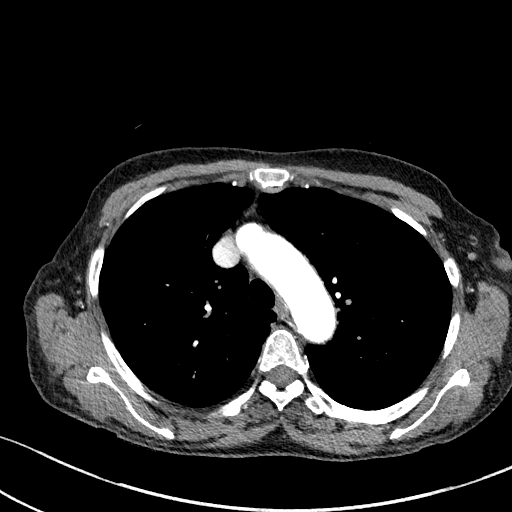
[im 98/142  lung]
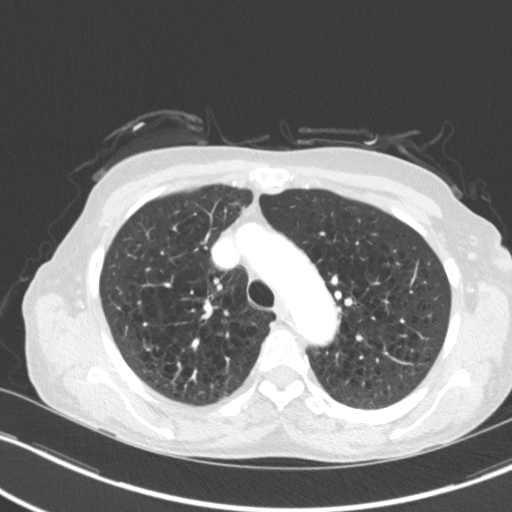
[im 109/142  lung]
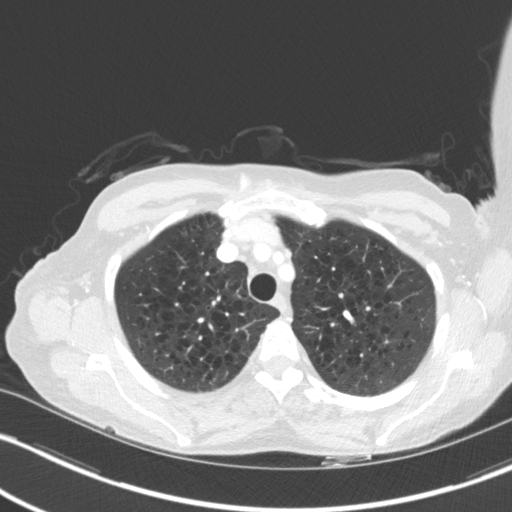
[im 120/142  lung]
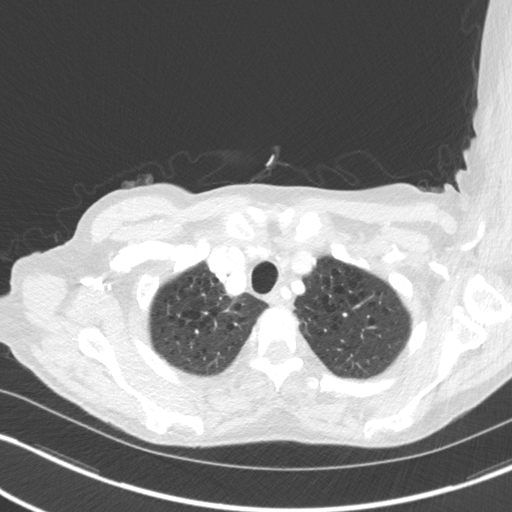
[im 131/142  lung]
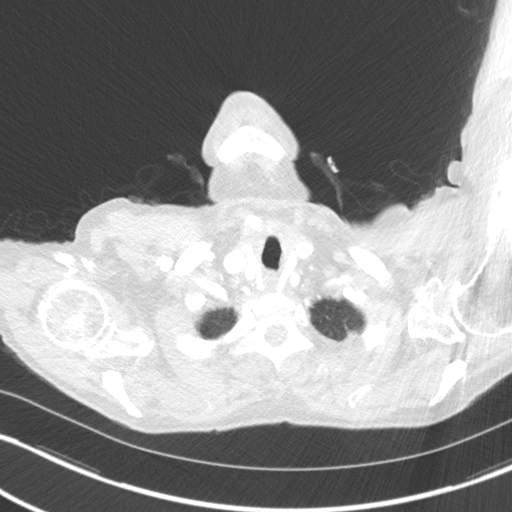

[Series 5: coronal · coronal · 0.57mm/px · 3 of 124 slices shown]
[im 25/124  lung]
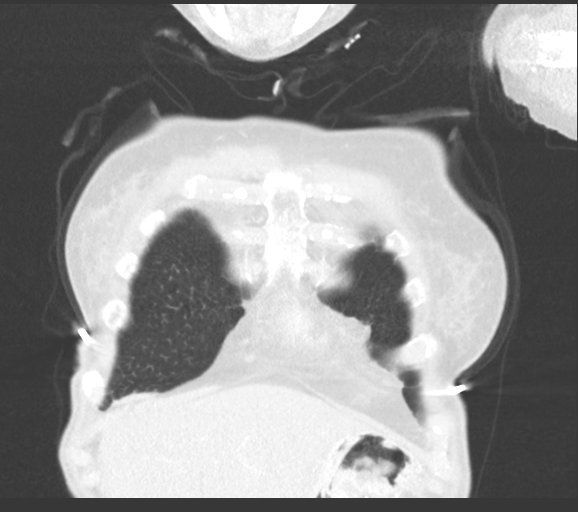
[im 50/124  lung]
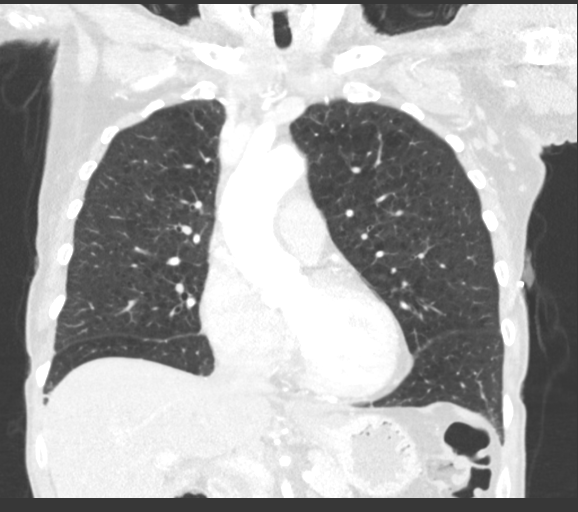
[im 74/124  lung]
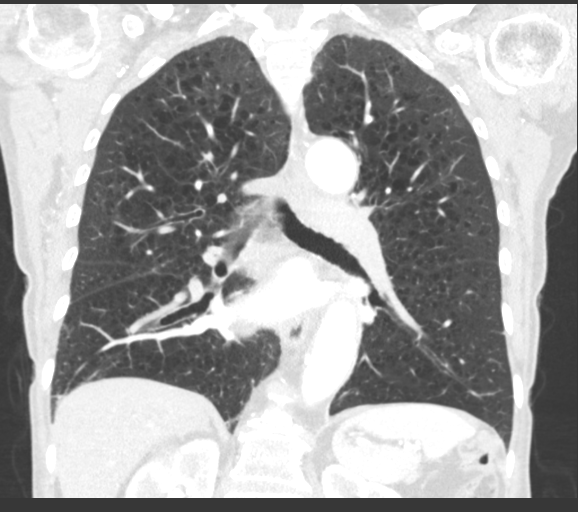

[15 of 36 positions shown; findings below may reference images not displayed]

FINDINGS: Cardiovascular: Normal heart size. No significant pericardial
effusion/thickening. Left anterior descending coronary
atherosclerosis. Atherosclerotic nonaneurysmal thoracic aorta.
Normal caliber pulmonary arteries. No central pulmonary emboli.

Mediastinum/Nodes: No discrete thyroid nodules. Unremarkable
esophagus. No pathologically enlarged axillary, mediastinal or hilar
lymph nodes.

Lungs/Pleura: No pneumothorax. No pleural effusion. Severe
centrilobular emphysema with mild diffuse bronchial wall thickening.
No acute consolidative airspace disease, lung masses or significant
pulmonary nodules.

Upper abdomen: Simple 3.3 cm upper right renal cyst.

Musculoskeletal: No aggressive appearing focal osseous lesions. Mild
thoracic spondylosis.
IMPRESSION: 1. No lung masses or significant pulmonary nodules.
2. Severe centrilobular emphysema with mild diffuse bronchial wall
thickening, suggesting COPD.
3. One vessel coronary atherosclerosis.

Aortic Atherosclerosis (9KOKX-1TA.A) and Emphysema (9KOKX-6R0.3).

## 2019-03-01 ENCOUNTER — Ambulatory Visit (INDEPENDENT_AMBULATORY_CARE_PROVIDER_SITE_OTHER): Payer: PPO | Admitting: Internal Medicine

## 2019-03-02 ENCOUNTER — Encounter (INDEPENDENT_AMBULATORY_CARE_PROVIDER_SITE_OTHER): Payer: Self-pay | Admitting: Internal Medicine

## 2019-03-15 ENCOUNTER — Encounter (INDEPENDENT_AMBULATORY_CARE_PROVIDER_SITE_OTHER): Payer: Self-pay | Admitting: Internal Medicine

## 2019-03-15 ENCOUNTER — Other Ambulatory Visit: Payer: Self-pay

## 2019-03-15 ENCOUNTER — Ambulatory Visit (INDEPENDENT_AMBULATORY_CARE_PROVIDER_SITE_OTHER): Payer: PPO | Admitting: Internal Medicine

## 2019-03-15 VITALS — BP 127/85 | HR 108 | Temp 98.0°F | Ht 63.0 in | Wt 113.5 lb

## 2019-03-15 DIAGNOSIS — R634 Abnormal weight loss: Secondary | ICD-10-CM | POA: Diagnosis not present

## 2019-03-15 DIAGNOSIS — K7010 Alcoholic hepatitis without ascites: Secondary | ICD-10-CM

## 2019-03-15 NOTE — Progress Notes (Signed)
Subjective:    Patient ID: Kim Reese, female    DOB: 12/30/1945, 73 y.o.   MRN: [id number]  HPI Here today for f/u. Last seen in December of 2019. Hx of alcoholic hepatitis.  Admitted to [facility] in August of 1660 for alcoholic hepatitis. Liver enzymes were elevated. Had also been taking Tylenol. At last OV in December of 2019 she had started drinking once a week (One shot of liquor). She has lost about 10 pounds since her last visit in December. She says her appetite is not good.  Her BMs are moving okay. Has a BM daily or every other day. No melena or BRRB.  She tells me today she continues to drink "a little bit". (Couple of shots a week).  She is not taking Tylenol.     Acute Hepatitis Panel was negative.     05/09/2018 [place] RUQ: elevated liver enzymes 1. No evidence of acute cholecystitis. 2. Hepatic steatosis.    CBC    Component Value Date/Time   WBC 6.8 08/30/2018 1248   RBC 4.39 08/30/2018 1248   HGB 13.2 08/30/2018 1248   HCT 39.3 08/30/2018 1248   PLT 195 08/30/2018 1248   MCV 89.5 08/30/2018 1248   MCH 30.1 08/30/2018 1248   MCHC 33.6 08/30/2018 1248   RDW 12.6 08/30/2018 1248   Hepatic Function Latest Ref Rng & Units 08/30/2018 05/26/2018 05/13/2018  Total Protein 6.1 - 8.1 g/dL 7.6 6.4 6.2(L)  Albumin 3.5 - 5.0 g/dL - - 2.5(L)  AST 10 - 35 U/L 24 27 133(H)  ALT 6 - 29 U/L 9 17 137(H)  Alk Phosphatase 38 - 126 U/L - - 118  Total Bilirubin 0.2 - 1.2 mg/dL 0.5 0.6 1.5(H)  Bilirubin, Direct 0.0 - 0.2 mg/dL 0.1 0.2 0.6(H)      Review of Systems Past Medical History:  Diagnosis Date  . Cataract   . Glaucoma   . Hyperlipidemia   . Hypertension   . Osteoarthritis     Past Surgical History:  Procedure Laterality Date  . CATARACT EXTRACTION W/PHACO  11/26/2011   Procedure: CATARACT EXTRACTION PHACO AND INTRAOCULAR LENS PLACEMENT (IOC);  Surgeon: Marylynn Pearson, MD;  Location: [facility];  Service: Ophthalmology;  Laterality: Left;    Allergies  Allergen  Reactions  . Penicillins Rash    Has patient had a PCN reaction causing immediate rash, facial/tongue/throat swelling, SOB or lightheadedness with hypotension: Yes Has patient had a PCN reaction causing severe rash involving mucus membranes or skin necrosis: No Has patient had a PCN reaction that required hospitalization: No Has patient had a PCN reaction occurring within the last 10 years: No If all of the above answers are "NO", then may proceed with Cephalosporin use.     Current Outpatient Medications on File Prior to Visit  Medication Sig Dispense Refill  . aspirin EC 81 MG tablet Take 81 mg by mouth daily.    . brimonidine-timolol (COMBIGAN) 0.2-0.5 % ophthalmic solution Place 1 drop into the right eye every 12 (twelve) hours.    . brinzolamide (AZOPT) 1 % ophthalmic suspension Place 1 drop into the right eye 3 (three) times daily.    . Cholecalciferol (VITAMIN D-3) 125 MCG (5000 UT) TABS Take by mouth. Dose unknown.    . cycloSPORINE (RESTASIS) 0.05 % ophthalmic emulsion Place 1 drop into both eyes 2 (two) times daily.    Marland Kitchen ibuprofen (ADVIL,MOTRIN) 200 MG tablet Take 200 mg by mouth every 6 (six) hours as needed. Patient states that  she takes 2 by mouth twice a day.    . latanoprost (XALATAN) 0.005 % ophthalmic solution Place 1 drop into both eyes at bedtime.     . Magnesium 250 MG TABS Take by mouth.    . Multiple Vitamin (MULITIVITAMIN WITH MINERALS) TABS Take 1 tablet by mouth daily.    Marland Kitchen spironolactone (ALDACTONE) 25 MG tablet Take 25 mg by mouth daily.    . Omega-3 Fatty Acids (FISH OIL PO) Take by mouth as needed.     . vitamin E 400 UNIT capsule Take 400 Units by mouth daily.     No current facility-administered medications on file prior to visit.         Objective:   Physical Exam Blood pressure 127/85, pulse (!) 108, temperature 98 F (36.7 C), height _0  (1.6 m), weight 113 lb 8 oz (51.5 kg). Alert and oriented. Skin warm and dry. Oral mucosa is moist.   . Sclera  anicteric, conjunctivae is pink. Thyroid not enlarged. No cervical lymphadenopathy. Lungs clear. Heart regular rate and rhythm.  Abdomen is soft. Bowel sounds are positive. No hepatomegaly. No abdominal masses felt. No tenderness.  No edema to lower extremities.          Assessment & Plan:  Alcoholic hepatitis. Am going to get a CBC, Hepatic, ferritin on her. Weight loss. She was encourage to eat 3 meals a day.  She will keep a diary of her weight x 2 weeks and bring to office.

## 2019-03-15 NOTE — Patient Instructions (Signed)
Labs today. Diary of weight x 2 weeks.

## 2019-03-16 LAB — HEPATIC FUNCTION PANEL
AG Ratio: 1.1 (calc) (ref 1.0–2.5)
ALT: 35 U/L — ABNORMAL HIGH (ref 6–29)
AST: 72 U/L — ABNORMAL HIGH (ref 10–35)
Albumin: 4.4 g/dL (ref 3.6–5.1)
Alkaline phosphatase (APISO): 64 U/L (ref 37–153)
Bilirubin, Direct: 0.3 mg/dL — ABNORMAL HIGH (ref 0.0–0.2)
Globulin: 3.9 g/dL (calc) — ABNORMAL HIGH (ref 1.9–3.7)
Indirect Bilirubin: 0.9 mg/dL (calc) (ref 0.2–1.2)
Total Bilirubin: 1.2 mg/dL (ref 0.2–1.2)
Total Protein: 8.3 g/dL — ABNORMAL HIGH (ref 6.1–8.1)

## 2019-03-16 LAB — CBC WITH DIFFERENTIAL/PLATELET
Absolute Monocytes: 905 cells/uL (ref 200–950)
Basophils Absolute: 26 cells/uL (ref 0–200)
Basophils Relative: 0.3 %
Eosinophils Absolute: 17 cells/uL (ref 15–500)
Eosinophils Relative: 0.2 %
HCT: 43.5 % (ref 35.0–45.0)
Hemoglobin: 14.8 g/dL (ref 11.7–15.5)
Lymphs Abs: 1905 cells/uL (ref 850–3900)
MCH: 32.6 pg (ref 27.0–33.0)
MCHC: 34 g/dL (ref 32.0–36.0)
MCV: 95.8 fL (ref 80.0–100.0)
MPV: 11.9 fL (ref 7.5–12.5)
Monocytes Relative: 10.4 %
Neutro Abs: 5846 cells/uL (ref 1500–7800)
Neutrophils Relative %: 67.2 %
Platelets: 231 10*3/uL (ref 140–400)
RBC: 4.54 10*6/uL (ref 3.80–5.10)
RDW: 13.6 % (ref 11.0–15.0)
Total Lymphocyte: 21.9 %
WBC: 8.7 10*3/uL (ref 3.8–10.8)

## 2019-03-16 LAB — FERRITIN: Ferritin: 832 ng/mL — ABNORMAL HIGH (ref 16–288)

## 2019-03-21 ENCOUNTER — Other Ambulatory Visit (INDEPENDENT_AMBULATORY_CARE_PROVIDER_SITE_OTHER): Payer: Self-pay | Admitting: *Deleted

## 2019-03-21 DIAGNOSIS — K7010 Alcoholic hepatitis without ascites: Secondary | ICD-10-CM

## 2019-04-11 DIAGNOSIS — F1020 Alcohol dependence, uncomplicated: Secondary | ICD-10-CM | POA: Diagnosis not present

## 2019-04-11 DIAGNOSIS — I10 Essential (primary) hypertension: Secondary | ICD-10-CM | POA: Diagnosis not present

## 2019-04-25 ENCOUNTER — Other Ambulatory Visit (INDEPENDENT_AMBULATORY_CARE_PROVIDER_SITE_OTHER): Payer: Self-pay | Admitting: *Deleted

## 2019-04-25 ENCOUNTER — Telehealth (INDEPENDENT_AMBULATORY_CARE_PROVIDER_SITE_OTHER): Payer: Self-pay | Admitting: Internal Medicine

## 2019-04-25 DIAGNOSIS — K7010 Alcoholic hepatitis without ascites: Secondary | ICD-10-CM

## 2019-04-25 NOTE — Telephone Encounter (Signed)
Please send copy of CBC, Hepatic and ferritin to Dr. Iona Beard in [place].

## 2019-04-26 NOTE — Telephone Encounter (Signed)
Lab report faxed to PCP ? ?

## 2019-05-16 DIAGNOSIS — K7010 Alcoholic hepatitis without ascites: Secondary | ICD-10-CM | POA: Diagnosis not present

## 2019-05-16 LAB — HEPATIC FUNCTION PANEL
AG Ratio: 1.1 (calc) (ref 1.0–2.5)
ALT: 15 U/L (ref 6–29)
AST: 24 U/L (ref 10–35)
Albumin: 3.4 g/dL — ABNORMAL LOW (ref 3.6–5.1)
Alkaline phosphatase (APISO): 50 U/L (ref 37–153)
Bilirubin, Direct: 0.2 mg/dL (ref 0.0–0.2)
Globulin: 3.2 g/dL (calc) (ref 1.9–3.7)
Indirect Bilirubin: 0.5 mg/dL (calc) (ref 0.2–1.2)
Total Bilirubin: 0.7 mg/dL (ref 0.2–1.2)
Total Protein: 6.6 g/dL (ref 6.1–8.1)

## 2019-07-12 DIAGNOSIS — F1020 Alcohol dependence, uncomplicated: Secondary | ICD-10-CM | POA: Diagnosis not present

## 2019-07-12 DIAGNOSIS — H401133 Primary open-angle glaucoma, bilateral, severe stage: Secondary | ICD-10-CM | POA: Diagnosis not present

## 2019-07-12 DIAGNOSIS — R945 Abnormal results of liver function studies: Secondary | ICD-10-CM | POA: Diagnosis not present

## 2019-07-12 DIAGNOSIS — H16223 Keratoconjunctivitis sicca, not specified as Sjogren's, bilateral: Secondary | ICD-10-CM | POA: Diagnosis not present

## 2019-07-12 DIAGNOSIS — E785 Hyperlipidemia, unspecified: Secondary | ICD-10-CM | POA: Diagnosis not present

## 2019-07-12 DIAGNOSIS — H25811 Combined forms of age-related cataract, right eye: Secondary | ICD-10-CM | POA: Diagnosis not present

## 2019-07-12 DIAGNOSIS — I10 Essential (primary) hypertension: Secondary | ICD-10-CM | POA: Diagnosis not present

## 2019-07-12 DIAGNOSIS — Z961 Presence of intraocular lens: Secondary | ICD-10-CM | POA: Diagnosis not present

## 2019-09-13 ENCOUNTER — Ambulatory Visit (INDEPENDENT_AMBULATORY_CARE_PROVIDER_SITE_OTHER): Payer: PPO | Admitting: Internal Medicine

## 2019-09-13 ENCOUNTER — Encounter (INDEPENDENT_AMBULATORY_CARE_PROVIDER_SITE_OTHER): Payer: Self-pay | Admitting: Internal Medicine

## 2019-09-13 ENCOUNTER — Other Ambulatory Visit: Payer: Self-pay

## 2019-09-13 DIAGNOSIS — Z8719 Personal history of other diseases of the digestive system: Secondary | ICD-10-CM | POA: Insufficient documentation

## 2019-09-13 DIAGNOSIS — R636 Underweight: Secondary | ICD-10-CM | POA: Diagnosis not present

## 2019-09-13 LAB — HEPATIC FUNCTION PANEL
AG Ratio: 1.1 (calc) (ref 1.0–2.5)
ALT: 20 U/L (ref 6–29)
AST: 32 U/L (ref 10–35)
Albumin: 3.9 g/dL (ref 3.6–5.1)
Alkaline phosphatase (APISO): 60 U/L (ref 37–153)
Bilirubin, Direct: 0.2 mg/dL (ref 0.0–0.2)
Globulin: 3.7 g/dL (calc) (ref 1.9–3.7)
Indirect Bilirubin: 0.6 mg/dL (calc) (ref 0.2–1.2)
Total Bilirubin: 0.8 mg/dL (ref 0.2–1.2)
Total Protein: 7.6 g/dL (ref 6.1–8.1)

## 2019-09-13 LAB — TSH: TSH: 0.83 mIU/L (ref 0.40–4.50)

## 2019-09-13 LAB — IRON, TOTAL/TOTAL IRON BINDING CAP
%SAT: 33 % (calc) (ref 16–45)
Iron: 82 ug/dL (ref 45–160)
TIBC: 247 mcg/dL (calc) — ABNORMAL LOW (ref 250–450)

## 2019-09-13 LAB — FERRITIN: Ferritin: 319 ng/mL — ABNORMAL HIGH (ref 16–288)

## 2019-09-13 NOTE — Progress Notes (Signed)
Presenting complaint;  History of alcoholic hepatitis.  Database and subjective:  Patient is 73 year old female who has a history of abnormal LFTs felt to be due to alcoholic hepatitis.  She was initially seen in August 2019 when she was hospitalized.  She was also anemic.  There was no evidence of GI bleed.  Was felt her anemia was due to malnutrition.  She was last seen in the office on 03/15/2019.  After not drinking alcohol for few months she went back to drinking once a week.  She had lost 10 pounds between December 2019 and June 2020.  Patient was advised to eat 3 meals daily. Lab studies on 03/15/2019 revealed AST of 72 and ALT of 35.  H&H was normal at 14.8 and 43.5 and serum ferritin was 832.  During that hospitalization in December it was 3393.  Patient returns for scheduled visit today.  She has lost another 4 pounds since her last visit.  She states she eats 1 meal a day.  She lives alone.  She did spend few days with her daughter in [place] during Thanksgiving and ate 3 meals a day.  She states she does not have a good appetite.  She fell 1 week ago hitting left side of her shoulder and back.  She states she slipped.  Since then she has been taking Advil daily.  She denies nausea vomiting abdominal pain melena or rectal bleeding.  Her bowels generally move every other day.  She says she drinks gin but not every day. She tries to walk some inside the house but does not do any other physical activity. Her last colonoscopy was in March 2011 revealing external hemorrhoids and right-sided diverticula.  Current Medications: Outpatient Encounter Medications as of 09/13/2019  Medication Sig  . aspirin EC 81 MG tablet Take 81 mg by mouth daily.  . brimonidine-timolol (COMBIGAN) 0.2-0.5 % ophthalmic solution Place 1 drop into the right eye every 12 (twelve) hours.  . brinzolamide (AZOPT) 1 % ophthalmic suspension Place 1 drop into the right eye 3 (three) times daily.  . Cholecalciferol (VITAMIN  D-3) 125 MCG (5000 UT) TABS Take by mouth. Dose unknown.  . cycloSPORINE (RESTASIS) 0.05 % ophthalmic emulsion Place 1 drop into both eyes 2 (two) times daily.  Marland Kitchen ibuprofen (ADVIL,MOTRIN) 200 MG tablet Take 200 mg by mouth every 6 (six) hours as needed. Patient states that she takes 2 by mouth twice a day.  . latanoprost (XALATAN) 0.005 % ophthalmic solution Place 1 drop into both eyes at bedtime.   . Magnesium 250 MG TABS Take by mouth daily.   . Multiple Vitamin (MULITIVITAMIN WITH MINERALS) TABS Take 1 tablet by mouth daily.  . Omega-3 Fatty Acids (FISH OIL PO) Take by mouth as needed.   Marland Kitchen spironolactone (ALDACTONE) 25 MG tablet Take 25 mg by mouth daily.  . vitamin E 400 UNIT capsule Take 400 Units by mouth daily.   No facility-administered encounter medications on file as of 09/13/2019.     Objective: Blood pressure (!) 145/100, pulse 91, temperature (!) 97.3 F (36.3 C), temperature source Tympanic, height '5\' 3"'$  (1.6 m), weight 109 lb 8 oz (49.7 kg). Patient is alert and in no acute distress. She is wearing a facial mask. She does not have tremors. Conjunctiva is pink. Sclera is nonicteric Oropharyngeal mucosa is normal. No neck masses or thyromegaly noted. Cardiac exam with regular rhythm normal S1 and S2. No murmur or gallop noted. Lungs are clear to auscultation. Abdomen is symmetrical soft  and nontender without organomegaly or masses.  Liver edge is somewhat firm but not tender. No LE edema or clubbing noted.  Labs/studies Results:  CBC Latest Ref Rng & Units 03/15/2019 08/30/2018 05/26/2018  WBC 3.8 - 10.8 Thousand/uL 8.7 6.8 6.5  Hemoglobin 11.7 - 15.5 g/dL 14.8 13.2 10.8(L)  Hematocrit 35.0 - 45.0 % 43.5 39.3 32.6(L)  Platelets 140 - 400 Thousand/uL 231 195 284    CMP Latest Ref Rng & Units 05/16/2019 03/15/2019 08/30/2018  Glucose 70 - 99 mg/dL - - -  BUN 8 - 23 mg/dL - - -  Creatinine 0.44 - 1.00 mg/dL - - -  Sodium 135 - 145 mmol/L - - -  Potassium 3.5 - 5.1 mmol/L  - - -  Chloride 98 - 111 mmol/L - - -  CO2 22 - 32 mmol/L - - -  Calcium 8.9 - 10.3 mg/dL - - -  Total Protein 6.1 - 8.1 g/dL 6.6 8.3(H) 7.6  Total Bilirubin 0.2 - 1.2 mg/dL 0.7 1.2 0.5  Alkaline Phos 38 - 126 U/L - - -  AST 10 - 35 U/L 24 72(H) 24  ALT 6 - 29 U/L 15 35(H) 9    Hepatic Function Latest Ref Rng & Units 05/16/2019 03/15/2019 08/30/2018  Total Protein 6.1 - 8.1 g/dL 6.6 8.3(H) 7.6  Albumin 3.5 - 5.0 g/dL - - -  AST 10 - 35 U/L 24 72(H) 24  ALT 6 - 29 U/L 15 35(H) 9  Alk Phosphatase 38 - 126 U/L - - -  Total Bilirubin 0.2 - 1.2 mg/dL 0.7 1.2 0.5  Bilirubin, Direct 0.0 - 0.2 mg/dL 0.2 0.3(H) 0.1      Assessment:  #1.  History of alcoholic hepatitis.  Transaminases were elevated 6 months ago.  LFTs however were normal 4 months ago.  I am concerned that she continues to drink alcohol.  She does not have any stigmata of chronic liver disease or portal hypertension but if she continues to drink she would end up with progressive liver disease and eventual decompensation.  Therefore she needs to quit drinking alcohol altogether.  #2.  Weight loss.  Weight loss appears to be in fact that she lives alone and does not prepare her meals.  I doubt that she has thyrotoxicosis but will screen her for this condition.  #3.  Elevated serum ferritin.  Serum ferritin was almost 3401-year ago and it dropped down to 832 six months ago.  Elevated ferritin secondary to alcoholic hepatitis and not due to hemochromatosis.    Plan:  Patient will go to the lab for serum TSH, LFTs, serum iron TIBC and ferritin. Patient advised to drink no more than 1 drink of alcohol per day if she must drink.  Ideally she should not be drinking. Patient encouraged to do regular physical activity such as walking.  She can start it for few minutes at a time and gradually increase it with a goal of 30 minutes/day. Patient advised to eat 3 meals instead of 1 and may be 2 snacks in between. Weight check in 2  months. Office visit in 6 months.  Please note patient's condition was discussed with her daughter Mardene Celeste who lives in [place], [place] but brought to the office for this visit.

## 2019-09-13 NOTE — Patient Instructions (Signed)
Weight check in 2 months. Try to eat 3 meals daily. Increase physical activity as tolerated. Remember you cannot have more than 1 drink of alcohol per day if you decide to continue drinking. Physician will call with results of blood test when completed.

## 2020-02-07 DIAGNOSIS — R945 Abnormal results of liver function studies: Secondary | ICD-10-CM | POA: Diagnosis not present

## 2020-02-07 DIAGNOSIS — I10 Essential (primary) hypertension: Secondary | ICD-10-CM | POA: Diagnosis not present

## 2020-02-27 DIAGNOSIS — M130 Polyarthritis, unspecified: Secondary | ICD-10-CM | POA: Diagnosis not present

## 2020-02-27 DIAGNOSIS — M159 Polyosteoarthritis, unspecified: Secondary | ICD-10-CM | POA: Diagnosis not present

## 2020-02-27 DIAGNOSIS — E785 Hyperlipidemia, unspecified: Secondary | ICD-10-CM | POA: Diagnosis not present

## 2020-02-27 DIAGNOSIS — I10 Essential (primary) hypertension: Secondary | ICD-10-CM | POA: Diagnosis not present

## 2020-03-13 ENCOUNTER — Ambulatory Visit (INDEPENDENT_AMBULATORY_CARE_PROVIDER_SITE_OTHER): Payer: PPO | Admitting: Internal Medicine

## 2020-03-13 ENCOUNTER — Encounter (INDEPENDENT_AMBULATORY_CARE_PROVIDER_SITE_OTHER): Payer: Self-pay | Admitting: Internal Medicine

## 2020-03-13 ENCOUNTER — Other Ambulatory Visit: Payer: Self-pay

## 2020-03-13 VITALS — BP 145/89 | HR 73 | Temp 98.6°F | Ht 63.0 in | Wt 120.0 lb

## 2020-03-13 DIAGNOSIS — Z8719 Personal history of other diseases of the digestive system: Secondary | ICD-10-CM | POA: Diagnosis not present

## 2020-03-13 DIAGNOSIS — H6121 Impacted cerumen, right ear: Secondary | ICD-10-CM | POA: Insufficient documentation

## 2020-03-13 DIAGNOSIS — R7989 Other specified abnormal findings of blood chemistry: Secondary | ICD-10-CM | POA: Diagnosis not present

## 2020-03-13 MED ORDER — DEBROX 6.5 % OT SOLN: 5.0000 [drp] | Freq: Two times a day (BID) | OTIC | 0 refills | Status: AC

## 2020-03-13 NOTE — Progress Notes (Signed)
Presenting complaint;  Follow-up for alcoholic hepatitis.  Database and subjective:  Patient is 74 year old Afro-American female who is here for scheduled visit accompanied by her daughter Mardene Celeste. Patient was last seen 6 months ago.  Her transaminases were normal but she still had been drinking alcohol.  There is also concerned about her weight loss and thyroid disease was ruled out.  She has history of elevated serum ferritin level felt to be due to alcoholic hepatitis and it has been trending down but was normal when last checked.  Patient has no complaints.  Her appetite is very good.  She has gained 11 pounds since her last visit 6 months ago.  She has received both doses of Covid vaccine without any problems.  Her daughter states she had blood work by Dr. Berdine Addison 1 month ago and LFTs were normal.  Patient denies nausea vomiting dysphagia or abdominal pain.  She has heartburn with certain foods which is not often.  Her bowels move every second or third day.  No history of melena or rectal bleeding.  She is taking Advil twice daily for arthritis.  She continues to drink alcohol intermittently.  She drinks wine.  She said she had 1 drink 2 days ago.  Patient lives alone.  Her daughter lives in [place] and her son lives in [place].  Her son comes at least twice a months to take her for yard.  Patient visits her daughter often at a time spends week or so with her.   Current Medications: Outpatient Encounter Medications as of 03/13/2020  Medication Sig  . aspirin EC 81 MG tablet Take 81 mg by mouth daily.  . brimonidine-timolol (COMBIGAN) 0.2-0.5 % ophthalmic solution Place 1 drop into the right eye every 12 (twelve) hours.  . brinzolamide (AZOPT) 1 % ophthalmic suspension Place 1 drop into the right eye 3 (three) times daily.  . Cholecalciferol (VITAMIN D-3) 125 MCG (5000 UT) TABS Take by mouth daily. Dose unknown.   . cycloSPORINE (RESTASIS) 0.05 % ophthalmic emulsion Place 1  drop into both eyes 2 (two) times daily.  Marland Kitchen ibuprofen (ADVIL,MOTRIN) 200 MG tablet Take 200 mg by mouth every 6 (six) hours as needed. Patient states that she takes 2 by mouth twice a day.  . latanoprost (XALATAN) 0.005 % ophthalmic solution Place 1 drop into both eyes at bedtime.   . Magnesium 250 MG TABS Take by mouth daily.   . Multiple Vitamin (MULITIVITAMIN WITH MINERALS) TABS Take 1 tablet by mouth daily.  . Omega-3 Fatty Acids (FISH OIL PO) Take by mouth as needed.   Marland Kitchen spironolactone (ALDACTONE) 25 MG tablet Take 25 mg by mouth daily.  . vitamin E 400 UNIT capsule Take 400 Units by mouth daily.   No facility-administered encounter medications on file as of 03/13/2020.     Objective: Blood pressure (!) 145/89, pulse 73, temperature 98.6 F (37 C), temperature source Oral, height '5\' 3"'$  (1.6 m), weight 120 lb (54.4 kg). Patient is alert and in no acute distress. Patient is wearing a mask. Conjunctiva is pink. Sclera is nonicteric Oropharyngeal mucosa is normal. She has several teeth missing.  Rest in fair condition. No neck masses or thyromegaly noted. Cardiac exam with regular rhythm normal S1 and S2. No murmur or gallop noted. Lungs are clear to auscultation. Abdomen is soft and nontender with organomegaly or masses. No LE edema or clubbing noted.  Labs/studies Results:  CBC Latest Ref Rng & Units 03/15/2019 08/30/2018 05/26/2018  WBC 3.8 -  10.8 Thousand/uL 8.7 6.8 6.5  Hemoglobin 11.7 - 15.5 g/dL 14.8 13.2 10.8(L)  Hematocrit 35 - 45 % 43.5 39.3 32.6(L)  Platelets 140 - 400 Thousand/uL 231 195 284    CMP Latest Ref Rng & Units 09/13/2019 05/16/2019 03/15/2019  Glucose 70 - 99 mg/dL - - -  BUN 8 - 23 mg/dL - - -  Creatinine 0.44 - 1.00 mg/dL - - -  Sodium 135 - 145 mmol/L - - -  Potassium 3.5 - 5.1 mmol/L - - -  Chloride 98 - 111 mmol/L - - -  CO2 22 - 32 mmol/L - - -  Calcium 8.9 - 10.3 mg/dL - - -  Total Protein 6.1 - 8.1 g/dL 7.6 6.6 8.3(H)  Total Bilirubin 0.2 - 1.2  mg/dL 0.8 0.7 1.2  Alkaline Phos 38 - 126 U/L - - -  AST 10 - 35 U/L 32 24 72(H)  ALT 6 - 29 U/L 20 15 35(H)    Hepatic Function Latest Ref Rng & Units 09/13/2019 05/16/2019 03/15/2019  Total Protein 6.1 - 8.1 g/dL 7.6 6.6 8.3(H)  Albumin 3.5 - 5.0 g/dL - - -  AST 10 - 35 U/L 32 24 72(H)  ALT 6 - 29 U/L 20 15 35(H)  Alk Phosphatase 38 - 126 U/L - - -  Total Bilirubin 0.2 - 1.2 mg/dL 0.8 0.7 1.2  Bilirubin, Direct 0.0 - 0.2 mg/dL 0.2 0.2 0.3(H)      Assessment:  #1.  History of alcoholic hepatitis.  She was quite ill back in August 2019 when she required hospitalization.  Her transaminases have returned to normal.  However she continues to drink alcohol intermittently.  I just hope that she will not get into prior situation again.  I am concerned because she lives alone. She does not have any stigmata of chronic liver disease.  #2.  Elevated serum ferritin felt to be due to alcoholic hepatitis.  At the peak of her illness in August 2019 serum ferritin level was 3393 and 832 1 year ago and 319 6 months ago.   Plan:  Will request copy of recent lab from Dr. Cathey Endow office. Will check serum ferritin. Once again patient reminded to abstain from drinking alcohol as she is at rest for excessive use and relapse of alcoholic hepatitis. Office visit in 6 months.

## 2020-03-13 NOTE — Patient Instructions (Signed)
Will request copy of lab from Dr. Adaline Sill office

## 2020-05-15 DIAGNOSIS — E785 Hyperlipidemia, unspecified: Secondary | ICD-10-CM | POA: Diagnosis not present

## 2020-05-15 DIAGNOSIS — I10 Essential (primary) hypertension: Secondary | ICD-10-CM | POA: Diagnosis not present

## 2020-05-15 DIAGNOSIS — F1020 Alcohol dependence, uncomplicated: Secondary | ICD-10-CM | POA: Diagnosis not present

## 2020-05-29 DIAGNOSIS — I10 Essential (primary) hypertension: Secondary | ICD-10-CM | POA: Diagnosis not present

## 2020-05-29 DIAGNOSIS — E785 Hyperlipidemia, unspecified: Secondary | ICD-10-CM | POA: Diagnosis not present

## 2020-05-29 DIAGNOSIS — M130 Polyarthritis, unspecified: Secondary | ICD-10-CM | POA: Diagnosis not present

## 2020-05-29 DIAGNOSIS — M159 Polyosteoarthritis, unspecified: Secondary | ICD-10-CM | POA: Diagnosis not present

## 2020-07-28 DIAGNOSIS — M130 Polyarthritis, unspecified: Secondary | ICD-10-CM | POA: Diagnosis not present

## 2020-07-28 DIAGNOSIS — M159 Polyosteoarthritis, unspecified: Secondary | ICD-10-CM | POA: Diagnosis not present

## 2020-07-28 DIAGNOSIS — I10 Essential (primary) hypertension: Secondary | ICD-10-CM | POA: Diagnosis not present

## 2020-07-28 DIAGNOSIS — E785 Hyperlipidemia, unspecified: Secondary | ICD-10-CM | POA: Diagnosis not present

## 2020-08-20 DIAGNOSIS — I10 Essential (primary) hypertension: Secondary | ICD-10-CM | POA: Diagnosis not present

## 2020-08-20 DIAGNOSIS — Z7189 Other specified counseling: Secondary | ICD-10-CM | POA: Diagnosis not present

## 2020-08-20 DIAGNOSIS — E785 Hyperlipidemia, unspecified: Secondary | ICD-10-CM | POA: Diagnosis not present

## 2020-09-18 ENCOUNTER — Ambulatory Visit (INDEPENDENT_AMBULATORY_CARE_PROVIDER_SITE_OTHER): Payer: PPO | Admitting: Internal Medicine

## 2020-09-28 DIAGNOSIS — E785 Hyperlipidemia, unspecified: Secondary | ICD-10-CM | POA: Diagnosis not present

## 2020-09-28 DIAGNOSIS — M130 Polyarthritis, unspecified: Secondary | ICD-10-CM | POA: Diagnosis not present

## 2020-09-28 DIAGNOSIS — I10 Essential (primary) hypertension: Secondary | ICD-10-CM | POA: Diagnosis not present

## 2020-09-28 DIAGNOSIS — M159 Polyosteoarthritis, unspecified: Secondary | ICD-10-CM | POA: Diagnosis not present

## 2020-11-23 ENCOUNTER — Encounter (HOSPITAL_COMMUNITY): Payer: Self-pay

## 2020-11-23 ENCOUNTER — Emergency Department (HOSPITAL_COMMUNITY): Payer: PPO

## 2020-11-23 ENCOUNTER — Other Ambulatory Visit: Payer: Self-pay

## 2020-11-23 ENCOUNTER — Emergency Department (HOSPITAL_COMMUNITY)
Admission: EM | Admit: 2020-11-23 | Discharge: 2020-11-23 | Disposition: A | Discharge status: Home / Self Care | Payer: PPO | Attending: Emergency Medicine | Admitting: Emergency Medicine

## 2020-11-23 DIAGNOSIS — R0602 Shortness of breath: Secondary | ICD-10-CM | POA: Insufficient documentation

## 2020-11-23 DIAGNOSIS — R079 Chest pain, unspecified: Secondary | ICD-10-CM | POA: Insufficient documentation

## 2020-11-23 DIAGNOSIS — Z5321 Procedure and treatment not carried out due to patient leaving prior to being seen by health care provider: Secondary | ICD-10-CM | POA: Insufficient documentation

## 2020-11-23 DIAGNOSIS — M549 Dorsalgia, unspecified: Secondary | ICD-10-CM | POA: Insufficient documentation

## 2020-11-23 NOTE — ED Notes (Signed)
Pt states she doesn't want to be seen anymore. States she fells fine now. Called and spoke with daughter.

## 2020-11-23 NOTE — ED Triage Notes (Signed)
Pt reports chest pain that started 3 days ago, pain comes and goes. Pt also reports back pain that started around the same time. Pt reports sob when walking.

## 2020-11-24 ENCOUNTER — Ambulatory Visit
Admission: EM | Admit: 2020-11-24 | Discharge: 2020-11-24 | Disposition: A | Discharge status: Home / Self Care | Payer: PPO | Attending: Emergency Medicine | Admitting: Emergency Medicine

## 2020-11-24 ENCOUNTER — Ambulatory Visit (INDEPENDENT_AMBULATORY_CARE_PROVIDER_SITE_OTHER): Payer: PPO

## 2020-11-24 ENCOUNTER — Encounter: Payer: Self-pay | Admitting: Emergency Medicine

## 2020-11-24 DIAGNOSIS — R0781 Pleurodynia: Secondary | ICD-10-CM

## 2020-11-24 DIAGNOSIS — W19XXXA Unspecified fall, initial encounter: Secondary | ICD-10-CM | POA: Diagnosis not present

## 2020-11-24 DIAGNOSIS — S2231XA Fracture of one rib, right side, initial encounter for closed fracture: Secondary | ICD-10-CM | POA: Diagnosis not present

## 2020-11-24 MED ORDER — IBUPROFEN 600 MG PO TABS: 600.0000 mg | ORAL_TABLET | Freq: Four times a day (QID) | ORAL | 0 refills | Status: AC | PRN

## 2020-11-24 NOTE — ED Triage Notes (Signed)
Pain to RT side rib area that started 4 days ago.  States she ran into a wall.

## 2020-11-24 NOTE — Discharge Instructions (Addendum)
Prescribed Profen 600 mg as needed for pain Follow RICE instructions as attached Follow-up with orthopedic Return or go to ED if you develop any new or worsening of your symptoms

## 2020-11-24 NOTE — ED Provider Notes (Signed)
[facility]   [id number] 11/24/20 Arrival Time: 0927   Chief Complaint  Patient presents with  . Rib Injury    SUBJECTIVE: History from: patient.  Kim Reese is a 75 y.o. female who presented to the urgent care with a complaint of right sided rib cage pain for the past 4 days.  Developed the symptom after running into the wall.  She localized the pain to the right rib cage.  She describes the pain as constant and achy.  She  has tried OTC medications without relief.  Her symptoms are made worse with movement and respiration.  She denies similar symptoms in the past.  Denies chills, fever, nausea, vomiting, diarrhea  ROS: As per HPI.  All other pertinent ROS negative.     Past Medical History:  Diagnosis Date  . Cataract   . Glaucoma   . Hyperlipidemia   . Hypertension   . Osteoarthritis    Past Surgical History:  Procedure Laterality Date  . CATARACT EXTRACTION W/PHACO  11/26/2011   Procedure: CATARACT EXTRACTION PHACO AND INTRAOCULAR LENS PLACEMENT (IOC);  Surgeon: Marylynn Pearson, MD;  Location: [facility];  Service: Ophthalmology;  Laterality: Left;   Allergies  Allergen Reactions  . Penicillins Rash    Has patient had a PCN reaction causing immediate rash, facial/tongue/throat swelling, SOB or lightheadedness with hypotension: Yes Has patient had a PCN reaction causing severe rash involving mucus membranes or skin necrosis: No Has patient had a PCN reaction that required hospitalization: No Has patient had a PCN reaction occurring within the last 10 years: No If all of the above answers are "NO", then may proceed with Cephalosporin use.    No current facility-administered medications on file prior to encounter.   Current Outpatient Medications on File Prior to Encounter  Medication Sig Dispense Refill  . aspirin EC 81 MG tablet Take 81 mg by mouth daily.    . brimonidine-timolol (COMBIGAN) 0.2-0.5 % ophthalmic solution Place 1 drop into the right eye every 12  (twelve) hours.    . brinzolamide (AZOPT) 1 % ophthalmic suspension Place 1 drop into the right eye 3 (three) times daily.    . carbamide peroxide (DEBROX) 6.5 % OTIC solution Place 5 drops into the left ear 2 (two) times daily. 15 mL 0  . Cholecalciferol (VITAMIN D-3) 125 MCG (5000 UT) TABS Take by mouth daily. Dose unknown.     . cycloSPORINE (RESTASIS) 0.05 % ophthalmic emulsion Place 1 drop into both eyes 2 (two) times daily.    Marland Kitchen latanoprost (XALATAN) 0.005 % ophthalmic solution Place 1 drop into both eyes at bedtime.     . Magnesium 250 MG TABS Take by mouth daily.     . Multiple Vitamin (MULITIVITAMIN WITH MINERALS) TABS Take 1 tablet by mouth daily.    . Omega-3 Fatty Acids (FISH OIL PO) Take by mouth as needed.     Marland Kitchen spironolactone (ALDACTONE) 25 MG tablet Take 25 mg by mouth daily.    . vitamin E 400 UNIT capsule Take 400 Units by mouth daily.     Social History   Socioeconomic History  . Marital status: Widowed    Spouse name: Not on file  . Number of children: Not on file  . Years of education: Not on file  . Highest education level: Not on file  Occupational History  . Not on file  Tobacco Use  . Smoking status: Former Smoker    Packs/day: 1.50    Types: Cigarettes  Start date: 10/15/1964    Quit date: 10/15/1997    Years since quitting: 23.1  . Smokeless tobacco: Never Used  Vaping Use  . Vaping Use: Never used  Substance and Sexual Activity  . Alcohol use: Yes    Comment: once or twice a week per PT  . Drug use: No  . Sexual activity: Yes  Other Topics Concern  . Not on file  Social History Narrative  . Not on file   Social Determinants of Health   Financial Resource Strain: Not on file  Food Insecurity: Not on file  Transportation Needs: Not on file  Physical Activity: Not on file  Stress: Not on file  Social Connections: Not on file  Intimate Partner Violence: Not on file   Family History  Problem Relation Age of Onset  . Hypertension Mother   .  Cancer Father   . Other Sister        multiple myeloma  . Asthma Son     OBJECTIVE:  Vitals:   11/24/20 0943  BP: (!) 146/91  Pulse: (!) 102  Resp: 18  Temp: 98.9 F (37.2 C)  TempSrc: Oral  SpO2: 97%     Physical Exam Vitals and nursing note reviewed.  Constitutional:      General: She is not in acute distress.    Appearance: Normal appearance. She is normal weight. She is not ill-appearing, toxic-appearing or diaphoretic.  HENT:     Head: Normocephalic.  Cardiovascular:     Rate and Rhythm: Normal rate and regular rhythm.     Pulses: Normal pulses.     Heart sounds: Normal heart sounds. No murmur heard. No friction rub. No gallop.   Pulmonary:     Effort: Pulmonary effort is normal. No respiratory distress.     Breath sounds: Normal breath sounds. No stridor. No wheezing, rhonchi or rales.  Chest:     Chest wall: No tenderness.  Musculoskeletal:        General: Tenderness and signs of injury present.     Comments: Rib cage tenderness present on palpation.  There is no ecchymosis, or pain wound, lesion, warmth, swelling or surface trauma present.    Neurological:     Mental Status: She is alert and oriented to person, place, and time.      LABS:  No results found for this or any previous visit (from the past 24 hour(s)).   RADIOLOGY  DG Ribs Unilateral W/Chest Right  Result Date: 11/24/2020 CLINICAL DATA:  Fall 4 days ago.  Right-sided rib pain. EXAM: RIGHT RIBS AND CHEST - 3+ VIEW COMPARISON:  05/08/2017 FINDINGS: Lungs are adequately inflated without consolidation, effusion or pneumothorax. Cardiomediastinal silhouette is within normal. There is a minimally displaced anterolateral right fifth rib fracture. Degenerative change of the spine and shoulders. IMPRESSION: 1. No acute cardiopulmonary disease. 2. Minimally displaced anterolateral right fifth rib fracture. Electronically Signed   By: Marin Olp M.D.   On: 11/24/2020 10:15      X-ray is positive  for minimally displaced anterolateral right fifth rib fracture.  I have reviewed the x-ray myself and the radiologist interpretation.  I am in agreement with the radiologist interpretation.  ASSESSMENT & PLAN:  1. Rib pain on right side   2. Closed fracture of one rib of right side, initial encounter     Meds ordered this encounter  Medications  . ibuprofen (ADVIL) 600 MG tablet    Sig: Take 1 tablet (600 mg total) by mouth every  6 (six) hours as needed for moderate pain.    Dispense:  30 tablet    Refill:  0    Discharge instructions  Prescribed Profen 600 mg as needed for pain Follow RICE instructions as attached Follow-up with orthopedic Return or go to ED if you develop any new or worsening of your symptoms  Reviewed expectations re: course of current medical issues. Questions answered. Outlined signs and symptoms indicating need for more acute intervention. Patient verbalized understanding. After Visit Summary given.         Emerson Monte, FNP 11/24/20 1046

## 2020-11-26 DIAGNOSIS — I10 Essential (primary) hypertension: Secondary | ICD-10-CM | POA: Diagnosis not present

## 2020-11-26 DIAGNOSIS — M130 Polyarthritis, unspecified: Secondary | ICD-10-CM | POA: Diagnosis not present

## 2020-11-26 DIAGNOSIS — E785 Hyperlipidemia, unspecified: Secondary | ICD-10-CM | POA: Diagnosis not present

## 2020-11-26 DIAGNOSIS — M159 Polyosteoarthritis, unspecified: Secondary | ICD-10-CM | POA: Diagnosis not present

## 2020-12-03 DIAGNOSIS — M159 Polyosteoarthritis, unspecified: Secondary | ICD-10-CM | POA: Diagnosis not present

## 2020-12-03 DIAGNOSIS — I10 Essential (primary) hypertension: Secondary | ICD-10-CM | POA: Diagnosis not present

## 2020-12-03 DIAGNOSIS — E785 Hyperlipidemia, unspecified: Secondary | ICD-10-CM | POA: Diagnosis not present

## 2020-12-03 DIAGNOSIS — F1099 Alcohol use, unspecified with unspecified alcohol-induced disorder: Secondary | ICD-10-CM | POA: Diagnosis not present

## 2020-12-18 ENCOUNTER — Telehealth (INDEPENDENT_AMBULATORY_CARE_PROVIDER_SITE_OTHER): Payer: Self-pay | Admitting: *Deleted

## 2020-12-18 ENCOUNTER — Ambulatory Visit (INDEPENDENT_AMBULATORY_CARE_PROVIDER_SITE_OTHER): Payer: PPO | Admitting: Internal Medicine

## 2020-12-18 ENCOUNTER — Encounter (INDEPENDENT_AMBULATORY_CARE_PROVIDER_SITE_OTHER): Payer: Self-pay | Admitting: Internal Medicine

## 2020-12-18 NOTE — Telephone Encounter (Signed)
Kim Reese was a no show today for her appointment with Dr. Karilyn Cota.

## 2021-03-28 DIAGNOSIS — M159 Polyosteoarthritis, unspecified: Secondary | ICD-10-CM | POA: Diagnosis not present

## 2021-03-28 DIAGNOSIS — E785 Hyperlipidemia, unspecified: Secondary | ICD-10-CM | POA: Diagnosis not present

## 2021-03-28 DIAGNOSIS — I10 Essential (primary) hypertension: Secondary | ICD-10-CM | POA: Diagnosis not present

## 2021-03-28 DIAGNOSIS — M130 Polyarthritis, unspecified: Secondary | ICD-10-CM | POA: Diagnosis not present

## 2021-05-27 DIAGNOSIS — I10 Essential (primary) hypertension: Secondary | ICD-10-CM | POA: Diagnosis not present

## 2021-05-27 DIAGNOSIS — Z961 Presence of intraocular lens: Secondary | ICD-10-CM | POA: Diagnosis not present

## 2021-05-27 DIAGNOSIS — E785 Hyperlipidemia, unspecified: Secondary | ICD-10-CM | POA: Diagnosis not present

## 2021-05-27 DIAGNOSIS — H16223 Keratoconjunctivitis sicca, not specified as Sjogren's, bilateral: Secondary | ICD-10-CM | POA: Diagnosis not present

## 2021-05-27 DIAGNOSIS — F1099 Alcohol use, unspecified with unspecified alcohol-induced disorder: Secondary | ICD-10-CM | POA: Diagnosis not present

## 2021-05-27 DIAGNOSIS — H25811 Combined forms of age-related cataract, right eye: Secondary | ICD-10-CM | POA: Diagnosis not present

## 2021-05-27 DIAGNOSIS — H401133 Primary open-angle glaucoma, bilateral, severe stage: Secondary | ICD-10-CM | POA: Diagnosis not present

## 2021-05-27 DIAGNOSIS — M159 Polyosteoarthritis, unspecified: Secondary | ICD-10-CM | POA: Diagnosis not present

## 2021-05-29 DIAGNOSIS — M130 Polyarthritis, unspecified: Secondary | ICD-10-CM | POA: Diagnosis not present

## 2021-05-29 DIAGNOSIS — M159 Polyosteoarthritis, unspecified: Secondary | ICD-10-CM | POA: Diagnosis not present

## 2021-05-29 DIAGNOSIS — I10 Essential (primary) hypertension: Secondary | ICD-10-CM | POA: Diagnosis not present

## 2021-05-29 DIAGNOSIS — E785 Hyperlipidemia, unspecified: Secondary | ICD-10-CM | POA: Diagnosis not present

## 2021-06-11 DIAGNOSIS — H16223 Keratoconjunctivitis sicca, not specified as Sjogren's, bilateral: Secondary | ICD-10-CM | POA: Diagnosis not present

## 2021-06-11 DIAGNOSIS — H401133 Primary open-angle glaucoma, bilateral, severe stage: Secondary | ICD-10-CM | POA: Diagnosis not present

## 2021-06-11 DIAGNOSIS — H25811 Combined forms of age-related cataract, right eye: Secondary | ICD-10-CM | POA: Diagnosis not present

## 2021-06-11 DIAGNOSIS — H26492 Other secondary cataract, left eye: Secondary | ICD-10-CM | POA: Diagnosis not present

## 2021-06-11 DIAGNOSIS — Z961 Presence of intraocular lens: Secondary | ICD-10-CM | POA: Diagnosis not present

## 2021-06-28 DIAGNOSIS — I10 Essential (primary) hypertension: Secondary | ICD-10-CM | POA: Diagnosis not present

## 2021-06-28 DIAGNOSIS — E785 Hyperlipidemia, unspecified: Secondary | ICD-10-CM | POA: Diagnosis not present

## 2021-06-28 DIAGNOSIS — M130 Polyarthritis, unspecified: Secondary | ICD-10-CM | POA: Diagnosis not present

## 2021-06-28 DIAGNOSIS — M159 Polyosteoarthritis, unspecified: Secondary | ICD-10-CM | POA: Diagnosis not present

## 2021-07-07 DIAGNOSIS — H2511 Age-related nuclear cataract, right eye: Secondary | ICD-10-CM | POA: Diagnosis not present

## 2021-07-11 DIAGNOSIS — H2511 Age-related nuclear cataract, right eye: Secondary | ICD-10-CM | POA: Diagnosis not present

## 2021-07-11 DIAGNOSIS — H401113 Primary open-angle glaucoma, right eye, severe stage: Secondary | ICD-10-CM | POA: Diagnosis not present

## 2021-09-27 DIAGNOSIS — M130 Polyarthritis, unspecified: Secondary | ICD-10-CM | POA: Diagnosis not present

## 2021-09-27 DIAGNOSIS — E785 Hyperlipidemia, unspecified: Secondary | ICD-10-CM | POA: Diagnosis not present

## 2021-09-27 DIAGNOSIS — I10 Essential (primary) hypertension: Secondary | ICD-10-CM | POA: Diagnosis not present

## 2021-09-27 DIAGNOSIS — M159 Polyosteoarthritis, unspecified: Secondary | ICD-10-CM | POA: Diagnosis not present

## 2021-11-11 DIAGNOSIS — H16223 Keratoconjunctivitis sicca, not specified as Sjogren's, bilateral: Secondary | ICD-10-CM | POA: Diagnosis not present

## 2021-11-11 DIAGNOSIS — H26492 Other secondary cataract, left eye: Secondary | ICD-10-CM | POA: Diagnosis not present

## 2021-11-11 DIAGNOSIS — Z961 Presence of intraocular lens: Secondary | ICD-10-CM | POA: Diagnosis not present

## 2021-11-11 DIAGNOSIS — H401133 Primary open-angle glaucoma, bilateral, severe stage: Secondary | ICD-10-CM | POA: Diagnosis not present

## 2022-03-19 DIAGNOSIS — Z961 Presence of intraocular lens: Secondary | ICD-10-CM | POA: Diagnosis not present

## 2022-03-19 DIAGNOSIS — H401133 Primary open-angle glaucoma, bilateral, severe stage: Secondary | ICD-10-CM | POA: Diagnosis not present

## 2022-03-19 DIAGNOSIS — H16223 Keratoconjunctivitis sicca, not specified as Sjogren's, bilateral: Secondary | ICD-10-CM | POA: Diagnosis not present

## 2022-07-07 DIAGNOSIS — M150 Primary generalized (osteo)arthritis: Secondary | ICD-10-CM | POA: Diagnosis not present

## 2022-07-07 DIAGNOSIS — F1090 Alcohol use, unspecified, uncomplicated: Secondary | ICD-10-CM | POA: Diagnosis not present

## 2022-07-07 DIAGNOSIS — E785 Hyperlipidemia, unspecified: Secondary | ICD-10-CM | POA: Diagnosis not present

## 2022-07-07 DIAGNOSIS — M159 Polyosteoarthritis, unspecified: Secondary | ICD-10-CM | POA: Diagnosis not present

## 2022-07-07 DIAGNOSIS — I10 Essential (primary) hypertension: Secondary | ICD-10-CM | POA: Diagnosis not present

## 2022-07-07 DIAGNOSIS — Z6823 Body mass index (BMI) 23.0-23.9, adult: Secondary | ICD-10-CM | POA: Diagnosis not present

## 2022-08-11 DIAGNOSIS — H401133 Primary open-angle glaucoma, bilateral, severe stage: Secondary | ICD-10-CM | POA: Diagnosis not present

## 2022-08-11 DIAGNOSIS — H26492 Other secondary cataract, left eye: Secondary | ICD-10-CM | POA: Diagnosis not present

## 2022-08-11 DIAGNOSIS — Z961 Presence of intraocular lens: Secondary | ICD-10-CM | POA: Diagnosis not present

## 2022-11-11 DIAGNOSIS — E7849 Other hyperlipidemia: Secondary | ICD-10-CM | POA: Diagnosis not present

## 2022-11-11 DIAGNOSIS — E785 Hyperlipidemia, unspecified: Secondary | ICD-10-CM | POA: Diagnosis not present

## 2022-11-11 DIAGNOSIS — I10 Essential (primary) hypertension: Secondary | ICD-10-CM | POA: Diagnosis not present

## 2022-11-11 DIAGNOSIS — F1090 Alcohol use, unspecified, uncomplicated: Secondary | ICD-10-CM | POA: Diagnosis not present

## 2022-12-15 DIAGNOSIS — H401133 Primary open-angle glaucoma, bilateral, severe stage: Secondary | ICD-10-CM | POA: Diagnosis not present

## 2022-12-15 DIAGNOSIS — H26492 Other secondary cataract, left eye: Secondary | ICD-10-CM | POA: Diagnosis not present

## 2023-02-03 DIAGNOSIS — J301 Allergic rhinitis due to pollen: Secondary | ICD-10-CM | POA: Diagnosis not present

## 2023-02-03 DIAGNOSIS — I10 Essential (primary) hypertension: Secondary | ICD-10-CM | POA: Diagnosis not present

## 2023-02-03 DIAGNOSIS — F1090 Alcohol use, unspecified, uncomplicated: Secondary | ICD-10-CM | POA: Diagnosis not present

## 2023-02-03 DIAGNOSIS — E785 Hyperlipidemia, unspecified: Secondary | ICD-10-CM | POA: Diagnosis not present

## 2023-02-03 DIAGNOSIS — E7800 Pure hypercholesterolemia, unspecified: Secondary | ICD-10-CM | POA: Diagnosis not present

## 2023-03-17 DIAGNOSIS — E785 Hyperlipidemia, unspecified: Secondary | ICD-10-CM | POA: Diagnosis not present

## 2023-03-17 DIAGNOSIS — I10 Essential (primary) hypertension: Secondary | ICD-10-CM | POA: Diagnosis not present

## 2023-04-20 DIAGNOSIS — H401133 Primary open-angle glaucoma, bilateral, severe stage: Secondary | ICD-10-CM | POA: Diagnosis not present

## 2023-06-03 DIAGNOSIS — H26492 Other secondary cataract, left eye: Secondary | ICD-10-CM | POA: Diagnosis not present

## 2023-06-03 DIAGNOSIS — Z961 Presence of intraocular lens: Secondary | ICD-10-CM | POA: Diagnosis not present

## 2023-06-03 DIAGNOSIS — H401133 Primary open-angle glaucoma, bilateral, severe stage: Secondary | ICD-10-CM | POA: Diagnosis not present

## 2023-06-09 DIAGNOSIS — D225 Melanocytic nevi of trunk: Secondary | ICD-10-CM | POA: Diagnosis not present

## 2023-06-09 DIAGNOSIS — Z6824 Body mass index (BMI) 24.0-24.9, adult: Secondary | ICD-10-CM | POA: Diagnosis not present

## 2023-06-09 DIAGNOSIS — M159 Polyosteoarthritis, unspecified: Secondary | ICD-10-CM | POA: Diagnosis not present

## 2023-06-09 DIAGNOSIS — I10 Essential (primary) hypertension: Secondary | ICD-10-CM | POA: Diagnosis not present

## 2023-06-09 DIAGNOSIS — E7800 Pure hypercholesterolemia, unspecified: Secondary | ICD-10-CM | POA: Diagnosis not present

## 2023-06-09 DIAGNOSIS — F1090 Alcohol use, unspecified, uncomplicated: Secondary | ICD-10-CM | POA: Diagnosis not present

## 2023-10-26 DIAGNOSIS — F1090 Alcohol use, unspecified, uncomplicated: Secondary | ICD-10-CM | POA: Diagnosis not present

## 2023-10-26 DIAGNOSIS — I10 Essential (primary) hypertension: Secondary | ICD-10-CM | POA: Diagnosis not present

## 2023-10-26 DIAGNOSIS — M150 Primary generalized (osteo)arthritis: Secondary | ICD-10-CM | POA: Diagnosis not present

## 2023-10-26 DIAGNOSIS — E7800 Pure hypercholesterolemia, unspecified: Secondary | ICD-10-CM | POA: Diagnosis not present

## 2023-10-26 DIAGNOSIS — D225 Melanocytic nevi of trunk: Secondary | ICD-10-CM | POA: Diagnosis not present

## 2023-10-27 DIAGNOSIS — Z961 Presence of intraocular lens: Secondary | ICD-10-CM | POA: Diagnosis not present

## 2023-10-27 DIAGNOSIS — H401133 Primary open-angle glaucoma, bilateral, severe stage: Secondary | ICD-10-CM | POA: Diagnosis not present

## 2023-10-27 DIAGNOSIS — H26492 Other secondary cataract, left eye: Secondary | ICD-10-CM | POA: Diagnosis not present

## 2024-01-05 DIAGNOSIS — E7800 Pure hypercholesterolemia, unspecified: Secondary | ICD-10-CM | POA: Diagnosis not present

## 2024-01-05 DIAGNOSIS — J301 Allergic rhinitis due to pollen: Secondary | ICD-10-CM | POA: Diagnosis not present

## 2024-01-05 DIAGNOSIS — D225 Melanocytic nevi of trunk: Secondary | ICD-10-CM | POA: Diagnosis not present

## 2024-01-05 DIAGNOSIS — M150 Primary generalized (osteo)arthritis: Secondary | ICD-10-CM | POA: Diagnosis not present

## 2024-01-05 DIAGNOSIS — I10 Essential (primary) hypertension: Secondary | ICD-10-CM | POA: Diagnosis not present

## 2024-01-05 DIAGNOSIS — F1090 Alcohol use, unspecified, uncomplicated: Secondary | ICD-10-CM | POA: Diagnosis not present

## 2024-02-24 DIAGNOSIS — H401133 Primary open-angle glaucoma, bilateral, severe stage: Secondary | ICD-10-CM | POA: Diagnosis not present

## 2024-02-24 DIAGNOSIS — H26492 Other secondary cataract, left eye: Secondary | ICD-10-CM | POA: Diagnosis not present

## 2024-02-24 DIAGNOSIS — Z961 Presence of intraocular lens: Secondary | ICD-10-CM | POA: Diagnosis not present

## 2024-04-05 DIAGNOSIS — M150 Primary generalized (osteo)arthritis: Secondary | ICD-10-CM | POA: Diagnosis not present

## 2024-04-05 DIAGNOSIS — F1090 Alcohol use, unspecified, uncomplicated: Secondary | ICD-10-CM | POA: Diagnosis not present

## 2024-04-05 DIAGNOSIS — D225 Melanocytic nevi of trunk: Secondary | ICD-10-CM | POA: Diagnosis not present

## 2024-04-05 DIAGNOSIS — Z9181 History of falling: Secondary | ICD-10-CM | POA: Diagnosis not present

## 2024-04-05 DIAGNOSIS — I10 Essential (primary) hypertension: Secondary | ICD-10-CM | POA: Diagnosis not present

## 2024-04-05 DIAGNOSIS — E7800 Pure hypercholesterolemia, unspecified: Secondary | ICD-10-CM | POA: Diagnosis not present

## 2024-06-29 DIAGNOSIS — H532 Diplopia: Secondary | ICD-10-CM | POA: Diagnosis not present

## 2024-06-29 DIAGNOSIS — Z961 Presence of intraocular lens: Secondary | ICD-10-CM | POA: Diagnosis not present

## 2024-06-29 DIAGNOSIS — H26492 Other secondary cataract, left eye: Secondary | ICD-10-CM | POA: Diagnosis not present

## 2024-06-29 DIAGNOSIS — H401133 Primary open-angle glaucoma, bilateral, severe stage: Secondary | ICD-10-CM | POA: Diagnosis not present

## 2024-07-05 DIAGNOSIS — F1090 Alcohol use, unspecified, uncomplicated: Secondary | ICD-10-CM | POA: Diagnosis not present

## 2024-07-05 DIAGNOSIS — I10 Essential (primary) hypertension: Secondary | ICD-10-CM | POA: Diagnosis not present

## 2024-07-05 DIAGNOSIS — L723 Sebaceous cyst: Secondary | ICD-10-CM | POA: Diagnosis not present

## 2024-07-05 DIAGNOSIS — D225 Melanocytic nevi of trunk: Secondary | ICD-10-CM | POA: Diagnosis not present

## 2024-07-05 DIAGNOSIS — E7800 Pure hypercholesterolemia, unspecified: Secondary | ICD-10-CM | POA: Diagnosis not present
